# Patient Record
Sex: Female | Born: 1980
Health system: Southern US, Community
[De-identification: ages and names within clinical notes are randomized; demographics above are authoritative.]

## PROBLEM LIST (undated history)

## (undated) DIAGNOSIS — B977 Papillomavirus as the cause of diseases classified elsewhere: Secondary | ICD-10-CM

## (undated) DIAGNOSIS — Z87898 Personal history of other specified conditions: Secondary | ICD-10-CM

## (undated) DIAGNOSIS — K219 Gastro-esophageal reflux disease without esophagitis: Secondary | ICD-10-CM

## (undated) HISTORY — DX: Papillomavirus as the cause of diseases classified elsewhere: B97.7

## (undated) HISTORY — DX: Gastro-esophageal reflux disease without esophagitis: K21.9

## (undated) HISTORY — DX: Personal history of other specified conditions: Z87.898

---
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
## 2002-12-23 HISTORY — PX: BREAST REDUCTION SURGERY: SHX8

## 2009-06-17 ENCOUNTER — Emergency Department (HOSPITAL_COMMUNITY): Admission: EM | Admit: 2009-06-17 | Discharge: 2009-06-17 | Payer: Self-pay | Admitting: Family Medicine

## 2010-02-10 ENCOUNTER — Inpatient Hospital Stay (HOSPITAL_COMMUNITY): Admission: AD | Admit: 2010-02-10 | Discharge: 2010-02-14 | Payer: Self-pay | Admitting: Obstetrics and Gynecology

## 2010-02-11 ENCOUNTER — Encounter (INDEPENDENT_AMBULATORY_CARE_PROVIDER_SITE_OTHER): Payer: Self-pay | Admitting: Obstetrics and Gynecology

## 2010-02-15 ENCOUNTER — Encounter: Admission: RE | Admit: 2010-02-15 | Discharge: 2010-03-17 | Payer: Self-pay | Admitting: Obstetrics and Gynecology

## 2010-04-15 ENCOUNTER — Encounter: Admission: RE | Admit: 2010-04-15 | Discharge: 2010-05-15 | Payer: Self-pay | Admitting: Obstetrics and Gynecology

## 2010-05-16 ENCOUNTER — Encounter: Admission: RE | Admit: 2010-05-16 | Discharge: 2010-06-15 | Payer: Self-pay | Admitting: Obstetrics and Gynecology

## 2010-06-16 ENCOUNTER — Encounter: Admission: RE | Admit: 2010-06-16 | Discharge: 2010-07-16 | Payer: Self-pay | Admitting: Obstetrics and Gynecology

## 2010-07-17 ENCOUNTER — Encounter: Admission: RE | Admit: 2010-07-17 | Discharge: 2010-08-16 | Payer: Self-pay | Admitting: Obstetrics and Gynecology

## 2010-08-17 ENCOUNTER — Encounter: Admission: RE | Admit: 2010-08-17 | Discharge: 2010-09-16 | Payer: Self-pay | Admitting: Obstetrics and Gynecology

## 2010-09-17 ENCOUNTER — Encounter: Admission: RE | Admit: 2010-09-17 | Discharge: 2010-09-17 | Payer: Self-pay | Admitting: Obstetrics and Gynecology

## 2011-03-13 LAB — CBC
HCT: 25.2 % — ABNORMAL LOW (ref 36.0–46.0)
HCT: 25.8 % — ABNORMAL LOW (ref 36.0–46.0)
HCT: 33.7 % — ABNORMAL LOW (ref 36.0–46.0)
Hemoglobin: 8.4 g/dL — ABNORMAL LOW (ref 12.0–15.0)
Hemoglobin: 8.7 g/dL — ABNORMAL LOW (ref 12.0–15.0)
MCHC: 33.3 g/dL (ref 30.0–36.0)
MCHC: 33.4 g/dL (ref 30.0–36.0)
MCV: 89.5 fL (ref 78.0–100.0)
Platelets: 161 10*3/uL (ref 150–400)
RBC: 2.86 MIL/uL — ABNORMAL LOW (ref 3.87–5.11)
RBC: 3.76 MIL/uL — ABNORMAL LOW (ref 3.87–5.11)
RDW: 14.1 % (ref 11.5–15.5)
WBC: 11.7 10*3/uL — ABNORMAL HIGH (ref 4.0–10.5)
WBC: 14.9 10*3/uL — ABNORMAL HIGH (ref 4.0–10.5)

## 2011-03-13 LAB — RH IMMUNE GLOB WKUP(>/=20WKS)(NOT WOMEN'S HOSP)

## 2011-04-01 LAB — CBC
HCT: 36.8 % (ref 36.0–46.0)
MCV: 88.8 fL (ref 78.0–100.0)
Platelets: 278 10*3/uL (ref 150–400)
WBC: 8.8 10*3/uL (ref 4.0–10.5)

## 2011-04-01 LAB — WET PREP, GENITAL: Yeast Wet Prep HPF POC: NONE SEEN

## 2011-04-01 LAB — POCT URINALYSIS DIP (DEVICE)
Bilirubin Urine: NEGATIVE
Glucose, UA: NEGATIVE mg/dL
Nitrite: NEGATIVE
pH: 7.5 (ref 5.0–8.0)

## 2011-04-01 LAB — URINE CULTURE
Colony Count: NO GROWTH
Culture: NO GROWTH

## 2011-04-01 LAB — GC/CHLAMYDIA PROBE AMP, GENITAL
Chlamydia, DNA Probe: NEGATIVE
GC Probe Amp, Genital: NEGATIVE

## 2011-06-17 ENCOUNTER — Other Ambulatory Visit: Payer: Self-pay | Admitting: Obstetrics and Gynecology

## 2011-11-02 ENCOUNTER — Encounter: Payer: Self-pay | Admitting: Emergency Medicine

## 2011-11-02 ENCOUNTER — Emergency Department (HOSPITAL_COMMUNITY)
Admission: EM | Admit: 2011-11-02 | Discharge: 2011-11-02 | Disposition: A | Discharge status: Home / Self Care | Payer: 59 | Attending: Emergency Medicine | Admitting: Emergency Medicine

## 2011-11-02 DIAGNOSIS — S61209A Unspecified open wound of unspecified finger without damage to nail, initial encounter: Secondary | ICD-10-CM | POA: Insufficient documentation

## 2011-11-02 DIAGNOSIS — Y92009 Unspecified place in unspecified non-institutional (private) residence as the place of occurrence of the external cause: Secondary | ICD-10-CM | POA: Insufficient documentation

## 2011-11-02 DIAGNOSIS — Z9889 Other specified postprocedural states: Secondary | ICD-10-CM | POA: Insufficient documentation

## 2011-11-02 DIAGNOSIS — S61219A Laceration without foreign body of unspecified finger without damage to nail, initial encounter: Secondary | ICD-10-CM

## 2011-11-02 DIAGNOSIS — W260XXA Contact with knife, initial encounter: Secondary | ICD-10-CM | POA: Insufficient documentation

## 2011-11-02 MED ORDER — TETANUS-DIPHTH-ACELL PERTUSSIS 5-2.5-18.5 LF-MCG/0.5 IM SUSP
0.5000 mL | Freq: Once | INTRAMUSCULAR | Status: AC
  Administered 2011-11-02: 0.5 mL via INTRAMUSCULAR

## 2011-11-02 MED ORDER — LIDOCAINE HCL (PF) 1 % IJ SOLN
5.0000 mL | Freq: Once | INTRAMUSCULAR | Status: AC
  Administered 2011-11-02: 5 mL
  Filled 2011-11-02: qty 5

## 2011-11-02 NOTE — ED Notes (Signed)
1.5 inch laceration to 4 th finger on l/hand- from butter knife

## 2011-11-02 NOTE — ED Provider Notes (Signed)
Medical screening examination/treatment/procedure(s) were performed by non-physician practitioner and as supervising physician I was immediately available for consultation/collaboration.  [employer]on [employer] R. Margree Gimbel, MD 11/02/11 1647 

## 2011-11-02 NOTE — ED Provider Notes (Signed)
History     CSN: [id number] Arrival date & time: 11/02/2011  9:08 AM   First MD Initiated Contact with Patient 11/02/11 [phone]      Chief Complaint  Patient presents with  . Extremity Laceration    1.5 inch lacertion 4 th finger r/hand    (Consider location/radiation/quality/duration/timing/severity/associated sxs/prior treatment) Patient is a 30 y.o. female presenting with skin laceration.  Laceration    Patient was using a butter knife at home this morning about an hour ago when it slipped and caused a laceration to her left ring finger. She is unsure of her tetanus status. Bleeding was well controlled by the time she arrived in the dept. She has no numbness or weakness to the finger and has no reduction in ROM.  History reviewed. No pertinent past medical history.  Past Surgical History  Procedure Date  . Breast reduction surgery   . Cesarean section     Family History  Problem Relation Age of Onset  . Diabetes Father   . Hypertension Father     History  Substance Use Topics  . Smoking status: Never Smoker   . Smokeless tobacco: Not on file  . Alcohol Use: No    OB History    Grav Para Term Preterm Abortions TAB SAB Ect Mult Living   2 2              Review of Systems ROS negative except as marked in HPI.  Allergies  Review of patient's allergies indicates no known allergies.  Home Medications   Current Outpatient Rx  Name Route Sig Dispense Refill  . LEVONORGESTREL 20 MCG/24HR IU IUD Intrauterine 1 each by Intrauterine route once.        BP 129/86  Pulse 94  Temp 98.2 F (36.8 C)  Resp 20  SpO2 98%  Physical Exam  Nursing note and vitals reviewed. Constitutional: She is oriented to person, place, and time. She appears well-developed and well-nourished. No distress.  HENT:  Head: Normocephalic and atraumatic.  Right Ear: External ear normal.  Left Ear: External ear normal.  Eyes: Conjunctivae are normal. Pupils are equal, round, and reactive  to light.  Musculoskeletal: Normal range of motion. She exhibits no tenderness.  Neurological: She is alert and oriented to person, place, and time.  Skin: Skin is warm and dry. No rash noted.       A small, relatively superficial laceration noted to the medial surface of the left ring finger at the PIP. It is U-shaped. Bleeding is well controlled. Patient has full flexion and extension as well as strength in resisted flexion and extension. She does not have any numbness to the finger. Capillary refill less than 3 seconds.    ED Course  Procedures (including critical care time)  LACERATION REPAIR Performed by: Grant Fontana Authorized by: Grant Fontana Consent: Verbal consent obtained. Risks and benefits: risks, benefits and alternatives were discussed Consent given by: patient Patient identity confirmed: provided demographic data Prepped and Draped in normal sterile fashion Wound explored  Laceration Location: L ring finger  Laceration Length: 1.5 cm  No Foreign Bodies seen or palpated  Anesthesia: digital block  Local anesthetic: lidocaine 1% without epinephrine  Anesthetic total: 2 ml  Irrigation method: syringe Amount of cleaning: standard  Skin closure: Prolene  Number of sutures: 3  Technique: simple interrupted  Patient tolerance: Patient tolerated the procedure well with no immediate complications.   Labs Reviewed - No data to display No results found.  1. Laceration of finger of left hand       MDM  Patient's wound was successfully closed with suture. She is instructed to return for suture removal. She is instructed regarding signs and symptoms of infection that would prompt a reevaluation sooner. She verbalized understanding and agreed to plan.       Grant Fontana, [place] 11/02/11 1625

## 2011-11-02 NOTE — ED Notes (Signed)
sutures and wound care completed by PA and RN

## 2012-04-28 ENCOUNTER — Encounter (HOSPITAL_COMMUNITY): Payer: Self-pay | Admitting: Emergency Medicine

## 2012-04-28 ENCOUNTER — Emergency Department (HOSPITAL_COMMUNITY)
Admission: EM | Admit: 2012-04-28 | Discharge: 2012-04-28 | Disposition: A | Discharge status: Home / Self Care | Payer: 59 | Source: Home / Self Care | Attending: Emergency Medicine | Admitting: Emergency Medicine

## 2012-04-28 DIAGNOSIS — J039 Acute tonsillitis, unspecified: Secondary | ICD-10-CM

## 2012-04-28 LAB — POCT RAPID STREP A: Streptococcus, Group A Screen (Direct): NEGATIVE

## 2012-04-28 MED ORDER — PENICILLIN V POTASSIUM 500 MG PO TABS: 500.0000 mg | ORAL_TABLET | Freq: Three times a day (TID) | ORAL | Status: AC

## 2012-04-28 NOTE — ED Provider Notes (Signed)
Chief Complaint  Patient presents with  . Sore Throat    History of Present Illness:   The patient is a 31 year old female who has had a three-day history of sore throat, pain on swallowing, right ear pain, chills, temperature up to 101, myalgias, headache, a slight cough. She has not been exposed to strep. She denies any nasal congestion, rhinorrhea, abdominal pain, nausea, or vomiting.  Review of Systems:  Other than noted above, the patient denies any of the following symptoms. Systemic:  No fever, chills, sweats, fatigue, myalgias, headache, or anorexia. Eye:  No redness, pain or drainage. ENT:  No earache, ear congestion, nasal congestion, sneezing, rhinorrhea, sinus pressure, sinus pain, post nasal drip, or sore throat. Lungs:  No cough, sputum production, wheezing, shortness of breath, or chest pain. GI:  No abdominal pain, nausea, vomiting, or diarrhea. Skin:  No rash or itching.  PMFSH:  Past medical history, family history, social history, meds, and allergies were reviewed.  Physical Exam:   Vital signs:  BP 120/57  Pulse 73  Temp(Src) 97.4 F (36.3 C) (Oral)  SpO2 98%  LMP 04/28/2012 General:  Alert, in no distress. Eye:  No conjunctival injection or drainage. Lids were normal. ENT:  TMs and canals were normal, without erythema or inflammation.  Nasal mucosa was clear and uncongested, without drainage.  Mucous membranes were moist.  Tonsils were enlarged and red, particularly the right side with some spots of white exudate on the right.  There were no oral ulcerations or lesions. Neck:  Supple, no adenopathy, tenderness or mass. Lungs:  No respiratory distress.  Lungs were clear to auscultation, without wheezes, rales or rhonchi.  Breath sounds were clear and equal bilaterally. Lungs were resonant to percussion.  No egophony. Heart:  Regular rhythm, without gallops, murmers or rubs. Skin:  Clear, warm, and dry, without rash or lesions.  Labs:   Results for orders placed  during the hospital encounter of 04/28/12  POCT RAPID STREP A ([facility] CARE ONLY)      Component Value Range   Streptococcus, Group A Screen (Direct) NEGATIVE  NEGATIVE    Assessment:  The encounter diagnosis was Tonsillitis.  Plan:   1.  The following meds were prescribed:   New Prescriptions   PENICILLIN V POTASSIUM (VEETID) 500 MG TABLET    Take 1 tablet (500 mg total) by mouth 3 (three) times daily.   2.  The patient was instructed in symptomatic care and handouts were given. 3.  The patient was told to return if becoming worse in any way, if no better in 3 or 4 days, and given some red flag symptoms that would indicate earlier return.   Reuben Likes, MD 04/28/12 1750

## 2012-04-28 NOTE — Discharge Instructions (Signed)
Tonsillitis Tonsils are lumps of lymphoid tissues at the back of the throat. Each tonsil has 20 crevices (crypts). Tonsils help fight nose and throat infections and keep infection from spreading to other parts of the body for the first 18 months of life. Tonsillitis is an infection of the throat that causes the tonsils to become red, tender, and swollen. CAUSES Sudden and, if treated, temporary (acute) tonsillitis is usually caused by infection with streptococcal bacteria. Long lasting (chronic) tonsillitis occurs when the crypts of the tonsils become filled with pieces of food and bacteria, which makes it easy for the tonsils to become constantly infected. SYMPTOMS  Symptoms of tonsillitis include:  A sore throat.   White patches on the tonsils.   Fever.   Tiredness.  DIAGNOSIS Tonsillitis can be diagnosed through a physical exam. Diagnosis can be confirmed with the results of lab tests, including a throat culture. TREATMENT  The goals of tonsillitis treatment include the reduction of the severity and duration of symptoms, prevention of associated conditions, and prevention of disease transmission. Tonsillitis caused by bacteria can be treated with antibiotics. Usually, treatment with antibiotics is started before the cause of the tonsillitis is known. However, if it is determined that the cause is not bacterial, antibiotics will not treat the tonsillitis. If attacks of tonsillitis are severe and frequent, your caregiver may recommend surgery to remove the tonsils (tonsillectomy). HOME CARE INSTRUCTIONS   Rest as much as possible and get plenty of sleep.   Drink plenty of fluids. While the throat is very sore, eat soft foods or liquids, such as sherbet, soups, or instant breakfast drinks.   Eat frozen ice pops.   Older children and adults may gargle with a warm or cold liquid to help soothe the throat. Mix 1 teaspoon of salt in 1 cup of water.   Other family members who also develop a  sore throat or fever should have a medical exam or throat culture.   Only take over-the-counter or prescription medicines for pain, discomfort, or fever as directed by your caregiver.   If you are given antibiotics, take them as directed. Finish them even if you start to feel better.  SEEK MEDICAL CARE IF:   Your baby is older than 3 months with a rectal temperature of 100.5 F (38.1 C) or higher for more than 1 day.   Large, tender lumps develop in your neck.   A rash develops.   Green, yellow-brown, or bloody substance is coughed up.   You are unable to swallow liquids or food for 24 hours.   Your child is unable to swallow food or liquids for 12 hours.  SEEK IMMEDIATE MEDICAL CARE IF:   You develop any new symptoms such as vomiting, severe headache, stiff neck, chest pain, or trouble breathing or swallowing.   You have severe throat pain along with drooling or voice changes.   You have severe pain, unrelieved with recommended medications.   You are unable to fully open the mouth.   You develop redness, swelling, or severe pain anywhere in the neck.   You have a fever.   Your baby is older than 3 months with a rectal temperature of 102 F (38.9 C) or higher.   Your baby is 12 months old or younger with a rectal temperature of 100.4 F (38 C) or higher.  MAKE SURE YOU:   Understand these instructions.   Will watch your condition.   Will get help right away if you are not  watch your condition.   Will get help right away if you are not doing well or get worse.  Document Released: 09/18/2005 Document Revised: 11/28/2011 Document Reviewed: 02/14/2011  ExitCare Patient Information 2012 [employer].

## 2012-04-28 NOTE — ED Notes (Addendum)
Sore throat , aches, right ear and right side of throat is painful. Onset Sunday evening.  Patient reports fever 101 this morning

## 2013-03-15 ENCOUNTER — Ambulatory Visit (INDEPENDENT_AMBULATORY_CARE_PROVIDER_SITE_OTHER): Payer: 59 | Admitting: Internal Medicine

## 2013-03-15 ENCOUNTER — Encounter: Payer: Self-pay | Admitting: Internal Medicine

## 2013-03-15 VITALS — BP 120/84 | HR 78 | Temp 98.2°F | Ht 68.0 in | Wt 245.0 lb

## 2013-03-15 DIAGNOSIS — Z6837 Body mass index (BMI) 37.0-37.9, adult: Secondary | ICD-10-CM | POA: Insufficient documentation

## 2013-03-15 NOTE — Assessment & Plan Note (Addendum)
She has been overweight all her life, in the last few years the lowest weight was 185, gained wt while  pregnant 3 years ago and even more weight  after the delivery. Went to a bariatric clinic 12-2012, started phentermine and hCG shots, has dropped a total of 12 pounds, 4 pounds even before she started the shots. She states that the shots and medication are temporary only, I warned her about the rebound weight gain. I encouraged her to develop healthy eating habits and stay active.

## 2013-03-15 NOTE — Progress Notes (Signed)
  Subjective:    Patient ID: Anna Cabrera, female    DOB: [birth year], 32 y.o.   MRN: [id number]  HPI Here to get stablished --BMI 37-- had issues with her weight all her life see assessment and plan. --History of GERD, improved since she started to lose weight. --Complains of fatigue, for more than a year, thinks related to a combination of factors including stress in general, also has a 38-year-old baby at home, some nights is difficult to sleep well. --Note that a ecchymoses at the left leg 2 weeks ago, area does not look completely normal yet. --Had labs 12-2012: We went over there results, all very good, LDL 94, TSH 3.2 which is normal. T4 normal as well.  Past Medical History  Diagnosis Date  . GERD (gastroesophageal reflux disease)   . H/O headache   . HPV in female   . BMI 37.0-37.9,adult    Past Surgical History  Procedure Laterality Date  . Breast reduction surgery  2004  . Cesarean section  2011  . Cyst removal hand  1996   History   Social History  . Marital Status: Married    Spouse Name: N/A    Number of Children: 1  . Years of Education: N/A   Occupational History  . manager  [employer]   Social History Main Topics  . Smoking status: Former Smoker    Quit date: 03/16/2003  . Smokeless tobacco: Never Used  . Alcohol Use: Yes     Comment: rarely   . Drug Use: Not on file  . Sexually Active: Not on file   Other Topics Concern  . Not on file   Social History Narrative   Associated degree    Family History  Problem Relation Age of Onset  . Diabetes Father   . Hypertension Father   . Stroke Other     F (age 82), GM  . CAD Father     CABG 60  . Cancer - Colon Neg Hx   . Breast cancer Neg Hx       Review of Systems Admits to snoring, denies inappropriate falling asleep. Used to have headaches, they are improved. Denies any nose or gum bleeding, no blood in the urine ;occ red blood per rectum, thinks related to constipation, symptoms are not  frequent. Diet has improved, still lacking exercise..     Objective:   Physical Exam  BP 120/84  Pulse 78  Temp(Src) 98.2 F (36.8 C) (Oral)  Ht 5\' 8"  (1.727 m)  Wt 245 lb (111.131 kg)  BMI 37.26 kg/m2  SpO2 98% General -- alert, well-developed  Neck --no thyromegaly   Lungs -- normal respiratory effort, no intercostal retractions, no accessory muscle use, and normal breath sounds.   Heart-- normal rate, regular rhythm, no murmur, and no gallop.    Extremities-- no pretibial edema bilaterally, left leg has an area of ecchymoses, probably 34 or 29 weeks old.  Neurologic-- alert & oriented X3 and strength normal in all extremities. Psych-- Cognition and judgment appear intact. Alert and cooperative with normal attention span and concentration.  not anxious appearing and not depressed appearing.      Assessment & Plan:  32 year old lady here to get established, doing well. Recent labs normal, including her TSH; recommend to repeat labs every 2 years.

## 2014-05-10 ENCOUNTER — Ambulatory Visit (HOSPITAL_BASED_OUTPATIENT_CLINIC_OR_DEPARTMENT_OTHER)
Admission: RE | Admit: 2014-05-10 | Discharge: 2014-05-10 | Disposition: A | Discharge status: Home / Self Care | Payer: 59 | Source: Ambulatory Visit | Attending: Internal Medicine | Admitting: Internal Medicine

## 2014-05-10 ENCOUNTER — Encounter: Payer: Self-pay | Admitting: Internal Medicine

## 2014-05-10 ENCOUNTER — Ambulatory Visit (INDEPENDENT_AMBULATORY_CARE_PROVIDER_SITE_OTHER): Payer: 59 | Admitting: Internal Medicine

## 2014-05-10 VITALS — BP 113/79 | HR 94 | Temp 99.5°F | Wt 237.0 lb

## 2014-05-10 DIAGNOSIS — J111 Influenza due to unidentified influenza virus with other respiratory manifestations: Secondary | ICD-10-CM

## 2014-05-10 DIAGNOSIS — R079 Chest pain, unspecified: Secondary | ICD-10-CM | POA: Insufficient documentation

## 2014-05-10 DIAGNOSIS — R0602 Shortness of breath: Secondary | ICD-10-CM | POA: Insufficient documentation

## 2014-05-10 LAB — POCT INFLUENZA A/B
INFLUENZA B, POC: POSITIVE
Influenza A, POC: POSITIVE

## 2014-05-10 MED ORDER — HYDROCODONE-HOMATROPINE 5-1.5 MG/5ML PO SYRP: 5.0000 mL | ORAL_SOLUTION | Freq: Every evening | ORAL | Status: DC | PRN

## 2014-05-10 NOTE — Progress Notes (Signed)
Pre-visit discussion using our clinic review tool. No additional management support is needed unless otherwise documented below in the visit note.  

## 2014-05-10 NOTE — Patient Instructions (Signed)
Get the XR at [facility], corner [address] (10 minutes form here); they are open 24/7 [address]  [place], [place] [id number] [phone]   Rest, fluids , tylenol For cough, take Mucinex DM twice a day as needed  If the cough is severe at night, take hydrocodone  Call if no better in few days Call anytime if the symptoms are severe, you have high fever, short of breath, chest pain     Influenza, Adult Influenza ("the flu") is a viral infection of the respiratory tract. It occurs more often in winter months because people spend more time in close contact with one another. Influenza can make you feel very sick. Influenza easily spreads from person to person (contagious). CAUSES  Influenza is caused by a virus that infects the respiratory tract. You can catch the virus by breathing in droplets from an infected person's cough or sneeze. You can also catch the virus by touching something that was recently contaminated with the virus and then touching your mouth, nose, or eyes. SYMPTOMS  Symptoms typically last 4 to 10 days and may include:  Fever.  Chills.  Headache, body aches, and muscle aches.  Sore throat.  Chest discomfort and cough.  Poor appetite.  Weakness or feeling tired.  Dizziness.  Nausea or vomiting. DIAGNOSIS  Diagnosis of influenza is often made based on your history and a physical exam. A nose or throat swab test can be done to confirm the diagnosis. RISKS AND COMPLICATIONS You may be at risk for a more severe case of influenza if you smoke cigarettes, have diabetes, have chronic heart disease (such as heart failure) or lung disease (such as asthma), or if you have a weakened immune system. Elderly people and pregnant women are also at risk for more serious infections. The most common complication of influenza is a lung infection (pneumonia). Sometimes, this complication can require emergency medical care and may be  life-threatening. PREVENTION  An annual influenza vaccination (flu shot) is the best way to avoid getting influenza. An annual flu shot is now routinely recommended for all adults in the [place] TREATMENT  In mild cases, influenza goes away on its own. Treatment is directed at relieving symptoms. For more severe cases, your caregiver may prescribe antiviral medicines to shorten the sickness. Antibiotic medicines are not effective, because the infection is caused by a virus, not by bacteria. HOME CARE INSTRUCTIONS  Only take over-the-counter or prescription medicines for pain, discomfort, or fever as directed by your caregiver.  Use a cool mist humidifier to make breathing easier.  Get plenty of rest until your temperature returns to normal. This usually takes 3 to 4 days.  Drink enough fluids to keep your urine clear or pale yellow.  Cover your mouth and nose when coughing or sneezing, and wash your hands well to avoid spreading the virus.  Stay home from work or school until your fever has been gone for at least 1 full day. SEEK MEDICAL CARE IF:   You have chest pain or a deep cough that worsens or produces more mucus.  You have nausea, vomiting, or diarrhea. SEEK IMMEDIATE MEDICAL CARE IF:   You have difficulty breathing, shortness of breath, or your skin or nails turn bluish.  You have severe neck pain or stiffness.  You have a severe headache, facial pain, or earache.  You have a worsening or recurring fever.  You have nausea or vomiting that cannot  be controlled. MAKE SURE YOU:  Understand these instructions.  Will watch your condition.  Will get help right away if you are not doing well or get worse. Document Released: 12/06/2000 Document Revised: 06/09/2012 Document Reviewed: 03/09/2012 [facility] Patient Information 2014 [employer], [place].

## 2014-05-10 NOTE — Progress Notes (Signed)
Subjective:    Patient ID: Anna Cabrera, female    DOB: 17-Jan-1981, 33 y.o.   MRN: [id number]  DOS:  05/10/2014 Type of  visit: Acute visit Symptoms started 4 days ago with fever up to 101.0, chills, generalized aches, cough. Some sputum production. She has frontal headaches mostly when she coughs. Also has noted right-sided thoracic pain   without cough only for the last few days. Her daughter is also sick with a febrile illness.  ROS [place] outside [place] did visit a farm 4 days ago. No sore throat, sinus pain or congestion, + mild nasal discharge. No SOB No difficulty breathing or actual chest pain. No nausea, vomiting, diarrhea.   Past Medical History  Diagnosis Date  . GERD (gastroesophageal reflux disease)   . H/O headache   . HPV in female   . BMI 37.0-37.9,adult     Past Surgical History  Procedure Laterality Date  . Breast reduction surgery  2004  . Cesarean section  2011  . Cyst removal hand  1996    History   Social History  . Marital Status: Married    Spouse Name: N/A    Number of Children: 1  . Years of Education: N/A   Occupational History  . manager  [employer]   Social History Main Topics  . Smoking status: Former Smoker    Quit date: 03/16/2003  . Smokeless tobacco: Never Used  . Alcohol Use: Yes     Comment: rarely   . Drug Use: Not on file  . Sexual Activity: Not on file   Other Topics Concern  . Not on file   Social History Narrative   Associated degree         Medication List       This list is accurate as of: 05/10/14  3:25 PM.  Always use your most recent med list.               ibuprofen 200 MG tablet  Commonly known as:  ADVIL,MOTRIN  Take 400 mg by mouth every 6 (six) hours as needed. For pain     levonorgestrel 20 MCG/24HR IUD  Commonly known as:  MIRENA  1 each by Intrauterine route once.           Objective:   Physical Exam BP 113/79  Pulse 94  Temp(Src) 99.5 F (37.5 C)  (Oral)  Wt 237 lb (107.502 kg)  SpO2 97% General -- alert, well-developed, NAD.  Nontoxic appearing. HEENT-- Not pale. TMs normal, throat symmetric, no redness or discharge. Face symmetric, sinuses not tender to palpation. Nose slt congested.  Lungs -- normal respiratory effort, no intercostal retractions, no accessory muscle use, and normal breath sounds.  Heart-- normal rate, regular rhythm, no murmur.  Extremities-- no pretibial edema bilaterally  Neurologic--  alert & oriented X3. Speech normal, gait normal, strength normal in all extremities.  Psych-- Cognition and judgment appear intact. Cooperative with normal attention span and concentration. No anxious or depressed appearing.        Assessment & Plan:   Influenza Fever and respiratory symptoms for the last 3 days, flu  test + positive. She does not look toxic, has been sick for more than 48 hours, I don't think Tamiflu is indicated. Conservative treatment with rest, fluids, Tylenol and cough suppression. A chest x-ray because the patient has a right-sided posterior chest pain, rule out pneumonia. Patient is aware she has influenza not a regular cold, if  she gets worse she needs to let me know.

## 2014-05-11 ENCOUNTER — Other Ambulatory Visit: Payer: Self-pay | Admitting: Internal Medicine

## 2014-05-11 DIAGNOSIS — R9389 Abnormal findings on diagnostic imaging of other specified body structures: Secondary | ICD-10-CM

## 2014-05-12 ENCOUNTER — Encounter: Payer: Self-pay | Admitting: *Deleted

## 2014-05-12 ENCOUNTER — Telehealth: Payer: Self-pay | Admitting: *Deleted

## 2014-05-12 NOTE — Telephone Encounter (Signed)
Pt returned call. Please call back.

## 2014-05-12 NOTE — Telephone Encounter (Signed)
Left a detailed message on pts voice mail making her aware that the Xray showed a shadow that was probably the nipple and that this needed to be repeated with a nipple marker.

## 2014-05-12 NOTE — Telephone Encounter (Signed)
Left message on voice mail for the patient to return my call regarding Xray results

## 2014-05-12 NOTE — Telephone Encounter (Signed)
Message copied by Baldwin JamaicaJOHNSON, Yolette Hastings G on Thu May 12, 2014  9:50 AM ------      Message from: Willow OraPAZ, JOSE E      Created: Wed May 11, 2014  4:16 PM       Advise patient, chest x-ray showed no pneumonia. She does has a small shadow, probably the nipple.       Advise she needs a repeated XR i already entered the order for a  chest x-ray with nipple markers. ------

## 2014-05-12 NOTE — Progress Notes (Signed)
Letter sent to patient with Xray results and note from Dr. Drue NovelPaz to repeat this.

## 2014-10-24 ENCOUNTER — Encounter: Payer: Self-pay | Admitting: Internal Medicine

## 2014-12-30 ENCOUNTER — Encounter: Payer: Self-pay | Admitting: Internal Medicine

## 2014-12-30 ENCOUNTER — Ambulatory Visit (INDEPENDENT_AMBULATORY_CARE_PROVIDER_SITE_OTHER): Payer: 59 | Admitting: Internal Medicine

## 2014-12-30 VITALS — BP 127/84 | HR 85 | Temp 98.1°F | Wt 254.5 lb

## 2014-12-30 DIAGNOSIS — M5441 Lumbago with sciatica, right side: Secondary | ICD-10-CM

## 2014-12-30 MED ORDER — PREDNISONE 10 MG PO TABS: ORAL_TABLET | ORAL | Status: DC

## 2014-12-30 NOTE — Progress Notes (Signed)
   Subjective:    Patient ID: Anna Cabrera, female    DOB: 18-Jul-1981, 34 y.o.   MRN: [id number]  DOS:  12/30/2014 Type of visit - description : acute Interval history: Symptoms started a few days ago with a stabbing, on and off pain at the right lower back, it was associated with a steady ache at the posterior aspect of the right leg up to the calf. Overall symptoms are somehow better. Had similar symptoms several months ago that self resolve.   ROS  Denies fever chills.No injury. No rash No bladder or bowel incontinence, no motor deficits      Past Medical History  Diagnosis Date  . GERD (gastroesophageal reflux disease)   . H/O headache   . HPV in female   . BMI 37.0-37.9,adult     Past Surgical History  Procedure Laterality Date  . Breast reduction surgery  2004  . Cesarean section  2011  . Cyst removal hand  1996    History   Social History  . Marital Status: Married    Spouse Name: N/A    Number of Children: 1  . Years of Education: N/A   Occupational History  . manager  [employer]   Social History Main Topics  . Smoking status: Former Smoker    Quit date: 03/16/2003  . Smokeless tobacco: Never Used  . Alcohol Use: Yes     Comment: rarely   . Drug Use: Not on file  . Sexual Activity: Not on file   Other Topics Concern  . Not on file   Social History Narrative   Associated degree         Medication List       This list is accurate as of: 12/30/14 11:59 PM.  Always use your most recent med list.               ibuprofen 200 MG tablet  Commonly known as:  ADVIL,MOTRIN  Take 400 mg by mouth every 6 (six) hours as needed. For pain     levonorgestrel 20 MCG/24HR IUD  Commonly known as:  MIRENA  1 each by Intrauterine route once.     predniSONE 10 MG tablet  Commonly known as:  DELTASONE  4 tablets x 2 days, 3 tabs x 2 days, 2 tabs x 2 days, 1 tab x 2 days           Objective:   Physical Exam BP 127/84 mmHg  Pulse 85  Temp(Src)  98.1 F (36.7 C) (Oral)  Wt 254 lb 8 oz (115.44 kg)  SpO2 96% General -- alert, well-developed, NAD.  Back--  Thoracic and lumbar spine no TTP, slightly tender at the right lower back. Extremities-- no pretibial edema bilaterally  Neurologic--  alert & oriented X3. Speech normal, gait appropriate for age, strength symmetric and appropriate for age.  DTRs symmetric. Straight leg test, ?  + on the right side.  Psych-- Cognition and judgment appear intact. Cooperative with normal attention span and concentration. No anxious or depressed appearing.     Assessment & Plan:

## 2014-12-30 NOTE — Progress Notes (Signed)
Pre visit review using our clinic review tool, if applicable. No additional management support is needed unless otherwise documented below in the visit note. 

## 2014-12-30 NOTE — Patient Instructions (Signed)
Take prednisone as prescribed for few days  For pain okay to take Tylenol or Motrin as needed  Warm compress  Once  better start physical therapy at home.  If the pain is intense,  not getting better or it keeps coming back ---- please call the office  [facility] Website with information about home physical therapy for back pain: [email or web address]/healthy-living/adult-health/multimedia/back-pain/[email or web address]

## 2014-12-31 DIAGNOSIS — M549 Dorsalgia, unspecified: Secondary | ICD-10-CM | POA: Insufficient documentation

## 2014-12-31 NOTE — Assessment & Plan Note (Signed)
Back pain with associated symptoms consistent with radiculopathy. Conservative treatment with prednisone, Tylenol or Motrin. Once better recommend physical therapy, we discussed referral versus self physical therapy and elected self physical therapy. See instructions.

## 2015-04-04 ENCOUNTER — Other Ambulatory Visit: Payer: Self-pay | Admitting: Obstetrics and Gynecology

## 2015-04-05 LAB — CYTOLOGY - PAP

## 2015-04-18 ENCOUNTER — Ambulatory Visit (INDEPENDENT_AMBULATORY_CARE_PROVIDER_SITE_OTHER): Payer: 59 | Admitting: Neurology

## 2015-04-18 ENCOUNTER — Encounter: Payer: Self-pay | Admitting: Neurology

## 2015-04-18 ENCOUNTER — Telehealth: Payer: Self-pay | Admitting: *Deleted

## 2015-04-18 VITALS — BP 115/81 | HR 75 | Temp 98.2°F | Ht 68.0 in | Wt 256.2 lb

## 2015-04-18 DIAGNOSIS — G43001 Migraine without aura, not intractable, with status migrainosus: Secondary | ICD-10-CM | POA: Diagnosis not present

## 2015-04-18 DIAGNOSIS — G441 Vascular headache, not elsewhere classified: Secondary | ICD-10-CM | POA: Diagnosis not present

## 2015-04-18 MED ORDER — DICLOFENAC POTASSIUM(MIGRAINE) 50 MG PO PACK: 50.0000 mg | PACK | Freq: Once | ORAL | Status: DC | PRN

## 2015-04-18 MED ORDER — ELETRIPTAN HYDROBROMIDE 40 MG PO TABS: 40.0000 mg | ORAL_TABLET | Freq: Once | ORAL | Status: DC

## 2015-04-18 NOTE — Progress Notes (Signed)
[facility]    Provider:  Dr Lucia Gaskins Referring Provider: Zelphia Cairo, MD Primary Care Physician:  Willow Ora, MD  CC:  Migraines  HPI:  Anna Cabrera is a 34 y.o. female here as a referral from Dr. Renaldo Fiddler for migraines  Migraines for years. Unilateral, right side, right ptosis, lacrimation, phonophobia, no photophobia. No nausea, no vomiting. Happens around menses. She is using an  IUD now. When she does get then, they are bad. 10/10. Pulsating around the eye. She was taking ibuprofen. Will ease it but not help. Imitrex made her feel bad. Fioricet helps but doesn't stop them. Has failed relpax in the past, sample given by her office. She felt light headed, flushed. Felt like she was going to pass out. No CP, no SOB. Severity can be worse than in previous years, not worse in frequency, but symptoms are similar to migraines she has always had, no new symptoms.    Review of Systems: Patient complains of symptoms per HPI as well as the following symptoms: headaches. No CP, No SOB. Pertinent negatives per HPI. All others negative.   History   Social History  . Marital Status: Married    Spouse Name: Anna Cabrera  . Number of Children: 1  . Years of Education: Associates   Occupational History  . Manager  [employer]   Social History Main Topics  . Smoking status: Former Smoker    Quit date: 03/16/2003  . Smokeless tobacco: Never Used  . Alcohol Use: 0.0 oz/week    0 Standard drinks or equivalent per week     Comment: rarely - wine  . Drug Use: No  . Sexual Activity: Not on file   Other Topics Concern  . Not on file   Social History Narrative   Lives at home with spouse and daughter   Right handed.   Caffeine use: Drinks 4-6 drinks per day (soda and tea)      Associated degree     Family History  Problem Relation Age of Onset  . Diabetes Father   . Hypertension Father   . Stroke Other     F (age 46), GM  . CAD Father     CABG 53  . Cancer -  Colon Neg Hx   . Breast cancer Neg Hx   . High Cholesterol Father   . Migraines Father   . Migraines Mother     Past Medical History  Diagnosis Date  . GERD (gastroesophageal reflux disease)   . H/O headache   . HPV in female   . BMI 37.0-37.9,adult     Past Surgical History  Procedure Laterality Date  . Breast reduction surgery  2004  . Cesarean section  2011  . Cyst removal hand Right 1996    Current Outpatient Prescriptions  Medication Sig Dispense Refill  . ACETAMINOPHEN-BUTALBITAL 50-325 MG TABS Take 1 tablet by mouth every 6 (six) hours as needed.    Marland Kitchen ibuprofen (ADVIL,MOTRIN) 200 MG tablet Take 800 mg by mouth every 6 (six) hours as needed. For pain    . levonorgestrel (MIRENA) 20 MCG/24HR IUD 1 each by Intrauterine route once.      . Diclofenac Potassium 50 MG PACK Take 50 mg by mouth once as needed. Take once daily as needed with headache onset. Please take with food 9 each 6  . eletriptan (RELPAX) 40 MG tablet Take 1 tablet (40 mg total) by mouth once. May repeat in 2 hours if headache persists or recurs.  10 tablet 6  . predniSONE (DELTASONE) 10 MG tablet 4 tablets x 2 days, 3 tabs x 2 days, 2 tabs x 2 days, 1 tab x 2 days (Patient not taking: Reported on 04/18/2015) 20 tablet 0   No current facility-administered medications for this visit.    Allergies as of 04/18/2015 - Review Complete 04/18/2015  Allergen Reaction Noted  . Maxidone [hydrocodone-acetaminophen] Nausea And Vomiting 04/18/2015  . Ortho evra [norelgestromin-eth estradiol]  04/18/2015    Vitals: BP 115/81 mmHg  Pulse 75  Temp(Src) 98.2 F (36.8 C)  Ht  (1.727 m)  Wt 256 lb 3.2 oz (116.212 kg)  BMI 38.96 kg/m2 Last Weight:  Wt Readings from Last 1 Encounters:  04/18/15 256 lb 3.2 oz (116.212 kg)   Last Height:   Ht Readings from Last 1 Encounters:  04/18/15  (1.727 m)   Physical exam: Exam: Gen: NAD, conversant, well nourised, well groomed                     CV: RRR, no MRG.  No Carotid Bruits. No peripheral edema, warm, nontender Eyes: Conjunctivae clear without exudates or hemorrhage  Neuro: Detailed Neurologic Exam  Speech:    Speech is normal; fluent and spontaneous with normal comprehension.  Cognition:    The patient is oriented to person, place, and time;     recent and remote memory intact;     language fluent;     normal attention, concentration,     fund of knowledge Cranial Nerves:    The pupils are equal, round, and reactive to light. The fundi are normal and spontaneous venous pulsations are present. Visual fields are full to finger confrontation. Extraocular movements are intact. Trigeminal sensation is intact and the muscles of mastication are normal. The face is symmetric. The palate elevates in the midline. Hearing intact. Voice is normal. Shoulder shrug is normal. The tongue has normal motion without fasciculations.   Coordination:    Normal finger to nose and heel to shin. Normal rapid alternating movements.   Gait:    Heel-toe and tandem gait are normal.   Motor Observation:    No asymmetry, no atrophy, and no involuntary movements noted. Tone:    Normal muscle tone.    Posture:    Posture is normal. normal erect    Strength:    Strength is V/V in the upper and lower limbs.      Sensation: intact to LT     Reflex Exam:  DTR's:    Deep tendon reflexes in the upper and lower extremities are normal bilaterally.   Toes:    The toes are downgoing bilaterally.   Clonus:    Clonus is absent.       Assessment/Plan:  34 year old female with migraines.  Relpax. Please take one tablet at the onset of your headache. If it does not improve the symptoms please take one additional tablet. Do not take more then 2 tablets in 24hrs. Do not take use more then 2 to 3 days in a week. Cambia at onset of headache.   Discussed the following: To prevent or relieve headaches, try the following: Cool Compress. Lie down and place a cool  compress on your head.  Avoid headache triggers. If certain foods or odors seem to have triggered your migraines in the past, avoid them. A headache diary might help you identify triggers.  Include physical activity in your daily routine. Try a daily walk or other moderate aerobic  exercise.  Manage stress. Find healthy ways to cope with the stressors, such as delegating tasks on your to-do list.  Practice relaxation techniques. Try deep breathing, yoga, massage and visualization.  Eat regularly. Eating regularly scheduled meals and maintaining a healthy diet might help prevent headaches. Also, drink plenty of fluids.  Follow a regular sleep schedule. Sleep deprivation might contribute to headaches Consider biofeedback. With this mind-body technique, you learn to control certain bodily functions - such as muscle tension, heart rate and blood pressure - to prevent headaches or reduce headache pain.    Proceed to emergency room if you experience new or worsening symptoms or symptoms do not resolve, if you have new neurologic symptoms or if headache is severe, or for any concerning symptom.    Naomie DeanAntonia Ahern, MD  [facility] [address] [place], [place] [id number]  Phone [phone] Fax [phone]

## 2015-04-18 NOTE — Telephone Encounter (Signed)
Faxed [employer] prescription and service form at 4:09pm and received fax confirmation.

## 2015-04-18 NOTE — Patient Instructions (Signed)
Overall you are doing fairly well but I do want to suggest a few things today:   Remember to drink plenty of fluid, eat healthy meals and do not skip any meals. Try to eat protein with a every meal and eat a healthy snack such as fruit or nuts in between meals. Try to keep a regular sleep-wake schedule and try to exercise daily, particularly in the form of walking, 20-30 minutes a day, if you can.   As far as your medications are concerned, I would like to suggest: Relpax. Please take one tablet at the onset of your headache. If it does not improve the symptoms please take one additional tablet. Do not take more then 2 tablets in 24hrs. Do not take use more then 2 to 3 days in a week. Cambia at onset of headache.   I would like to see you back in 3 months, sooner if we need to. Please call us with any interim questions, concerns, problems, updates or refill requests.   Please also call us for any test results so we can go over those with you on the phone.  My clinical assistant and will answer any of your questions and relay your messages to me and also relay most of my messages to you.   Our phone number is [phone]. We also have an after hours call service for urgent matters and there is a physician on-call for urgent questions. For any emergencies you know to call 911 or go to the nearest emergency room  To prevent or relieve headaches, try the following: Cool Compress. Lie down and place a cool compress on your head.  Avoid headache triggers. If certain foods or odors seem to have triggered your migraines in the past, avoid them. A headache diary might help you identify triggers.  Include physical activity in your daily routine. Try a daily walk or other moderate aerobic exercise.  Manage stress. Find healthy ways to cope with the stressors, such as delegating tasks on your to-do list.  Practice relaxation techniques. Try deep breathing, yoga, massage and visualization.  Eat regularly.  Eating regularly scheduled meals and maintaining a healthy diet might help prevent headaches. Also, drink plenty of fluids.  Follow a regular sleep schedule. Sleep deprivation might contribute to headaches Consider biofeedback. With this mind-body technique, you learn to control certain bodily functions - such as muscle tension, heart rate and blood pressure - to prevent headaches or reduce headache pain.    Proceed to emergency room if you experience new or worsening symptoms or symptoms do not resolve, if you have new neurologic symptoms or if headache is severe, or for any concerning symptom.

## 2015-07-18 ENCOUNTER — Ambulatory Visit: Payer: 59 | Admitting: Neurology

## 2015-07-19 ENCOUNTER — Encounter: Payer: Self-pay | Admitting: Neurology

## 2015-07-30 ENCOUNTER — Telehealth: Payer: Self-pay | Admitting: Internal Medicine

## 2015-07-30 DIAGNOSIS — R911 Solitary pulmonary nodule: Secondary | ICD-10-CM

## 2015-07-30 NOTE — Telephone Encounter (Signed)
Advise pt: a year ago her CXR showed a nodule, needs to be repeated: Enter orders for:  CXR  CXR w/ a niple marker  Dx pulm nodule

## 2015-07-31 NOTE — Telephone Encounter (Signed)
Chest x-rays ordered, one normal chest x-ray and chest x-ray with left and right sided nipple markers.

## 2016-04-04 DIAGNOSIS — Z6835 Body mass index (BMI) 35.0-35.9, adult: Secondary | ICD-10-CM | POA: Diagnosis not present

## 2016-04-04 DIAGNOSIS — Z01419 Encounter for gynecological examination (general) (routine) without abnormal findings: Secondary | ICD-10-CM | POA: Diagnosis not present

## 2016-04-15 DIAGNOSIS — R102 Pelvic and perineal pain: Secondary | ICD-10-CM | POA: Diagnosis not present

## 2016-11-18 ENCOUNTER — Telehealth: Payer: Self-pay

## 2016-11-18 DIAGNOSIS — R9389 Abnormal findings on diagnostic imaging of other specified body structures: Secondary | ICD-10-CM

## 2016-11-18 NOTE — Telephone Encounter (Signed)
-----   Message from Wanda PlumpJose E Paz, MD sent at 11/16/2016  9:31 PM EST ----- Regarding: call pt  Call pt: due to repeat a CXR, enter order dx  Lung nodule. if unable to reach pt, send a letter AND a mychart message: Anna Cabrera, we tried to contact you regards the need to redo a chest XR because in the past we saw a small spot and we need to know is not growing, please contact our office

## 2016-11-18 NOTE — Telephone Encounter (Signed)
LMOM informing Pt to return call. MyChart message sent and letter printed and mailed to Pt requesting call back. Chest x-ray ordered.

## 2016-11-25 ENCOUNTER — Ambulatory Visit (INDEPENDENT_AMBULATORY_CARE_PROVIDER_SITE_OTHER): Payer: 59 | Admitting: Internal Medicine

## 2016-11-25 ENCOUNTER — Encounter: Payer: Self-pay | Admitting: Internal Medicine

## 2016-11-25 ENCOUNTER — Ambulatory Visit (HOSPITAL_BASED_OUTPATIENT_CLINIC_OR_DEPARTMENT_OTHER)
Admission: RE | Admit: 2016-11-25 | Discharge: 2016-11-25 | Disposition: A | Discharge status: Home / Self Care | Payer: 59 | Source: Ambulatory Visit | Attending: Internal Medicine | Admitting: Internal Medicine

## 2016-11-25 VITALS — BP 118/74 | HR 83 | Temp 98.2°F | Resp 14 | Ht 68.0 in | Wt 254.4 lb

## 2016-11-25 DIAGNOSIS — R938 Abnormal findings on diagnostic imaging of other specified body structures: Secondary | ICD-10-CM | POA: Insufficient documentation

## 2016-11-25 DIAGNOSIS — Z0000 Encounter for general adult medical examination without abnormal findings: Secondary | ICD-10-CM | POA: Diagnosis not present

## 2016-11-25 DIAGNOSIS — R911 Solitary pulmonary nodule: Secondary | ICD-10-CM | POA: Insufficient documentation

## 2016-11-25 DIAGNOSIS — R9389 Abnormal findings on diagnostic imaging of other specified body structures: Secondary | ICD-10-CM

## 2016-11-25 DIAGNOSIS — R918 Other nonspecific abnormal finding of lung field: Secondary | ICD-10-CM | POA: Diagnosis not present

## 2016-11-25 NOTE — Assessment & Plan Note (Addendum)
Td 2012, had a flu shot [facility]- never had a cscope  Female care-- Dr Vickey Sagesatkins  Labs : Will come back fasting for CMP, CBC, FLP, TSH BMI is 38, diet and exercise discussed.

## 2016-11-25 NOTE — Patient Instructions (Addendum)
  GO TO THE FRONT DESK Schedule your next appointment for a physical exam in 1 year    Schedule labs to be done this week   STOP BY THE FIRST FLOOR:  get the XR

## 2016-11-25 NOTE — Progress Notes (Signed)
Subjective:    Patient ID: Anna Cabrera, female    DOB: [birth year], 35 y.o.   MRN: [id number]  DOS:  11/25/2016 Type of visit - description : CPX Interval history: No major concerns    Review of Systems   Constitutional: No fever. No chills. No unexplained wt changes. No unusual sweats  HEENT: No dental problems, no ear discharge, no facial swelling, no voice changes. No eye discharge, no eye  redness , no  intolerance to light   Respiratory: No wheezing , no  difficulty breathing. Had a URI and some cough last week, getting better.  Cardiovascular: No CP, no leg swelling , no  Palpitations  GI: no nausea, no vomiting, no diarrhea , no  abdominal pain.  No blood in the stools. No dysphagia, no odynophagia    Endocrine: No polyphagia, no polyuria , no polydipsia  GU: No dysuria, gross hematuria, difficulty urinating. No urinary urgency, no frequency.  Musculoskeletal: Occasionally has a sharp neck pain with some radiation to the right shoulder and arm, only with certain positions, no persistent pain. No numbness  Skin: No change in the color of the skin, palor , no  Rash  Allergic, immunologic: No environmental allergies , no  food allergies  Neurological: No dizziness no  syncope. No headaches. No diplopia, no slurred, no slurred speech, no motor deficits, no facial  Numbness  Hematological: No enlarged lymph nodes, no easy bruising , no unusual bleedings  Psychiatry: No suicidal ideas, no hallucinations, no beavior problems, no confusion.  No unusual/severe anxiety, no depression   Past Medical History:  Diagnosis Date  . GERD (gastroesophageal reflux disease)   . H/O headache   . HPV in female     Past Surgical History:  Procedure Laterality Date  . BREAST REDUCTION SURGERY  2004  . CESAREAN SECTION  2011  . CYST REMOVAL HAND Right 1996    Social History   Social History  . Marital status: Married    Spouse name: [name]  . Number of children: 1  .  Years of education: Associates   Occupational History  . Manager     Social History Main Topics  . Smoking status: Former Smoker    Quit date: 03/16/2003  . Smokeless tobacco: Never Used  . Alcohol use 0.0 oz/week     Comment: rarely - wine  . Drug use: No  . Sexual activity: Not on file   Other Topics Concern  . Not on file   Social History Narrative   Lives at home with spouse and daughter   Right handed.   Caffeine use: Drinks 4-6 drinks per day (soda and tea)   Associated degree      Family History  Problem Relation Age of Onset  . Migraines Mother   . Diabetes Father   . Hypertension Father   . CAD Father     CABG 3969  . High Cholesterol Father   . Migraines Father   . Stroke Father   . Stroke Other     F (age 37), GM  . Cancer - Colon Neg Hx   . Breast cancer Neg Hx        Medication List       Accurate as of 11/25/16 11:59 PM. Always use your most recent med list.          Diclofenac Potassium 50 MG Pack Take 50 mg by mouth once as needed. Take once daily as needed with headache onset.  Please take with food   eletriptan 40 MG tablet Commonly known as:  RELPAX Take 1 tablet (40 mg total) by mouth once. May repeat in 2 hours if headache persists or recurs.   levonorgestrel 20 MCG/24HR IUD Commonly known as:  MIRENA 1 each by Intrauterine route once.          Objective:   Physical Exam BP 118/74 (BP Location: Left Arm, Patient Position: Sitting, Cuff Size: Normal)   Pulse 83   Temp 98.2 F (36.8 C) (Oral)   Resp 14   Ht 5\' 8"  (1.727 m)   Wt 254 lb 6 oz (115.4 kg)   SpO2 98%   BMI 38.68 kg/m   General:   Well developed, well nourished . NAD.  Neck: No  thyromegaly . No TTP and full range of motion HEENT:  Normocephalic . Face symmetric, atraumatic Lungs:  CTA B Normal respiratory effort, no intercostal retractions, no accessory muscle use. Heart: RRR,  no murmur.  No pretibial edema bilaterally  Abdomen:  Not  distended, soft, non-tender. No rebound or rigidity.   Skin: Exposed areas without rash. Not pale. Not jaundice Neurologic:  alert & oriented X3.  Speech normal, gait appropriate for age and unassisted Strength symmetric and appropriate for age.  DTRs symmetric Psych: Cognition and judgment appear intact.  Cooperative with normal attention span and concentration.  Behavior appropriate. No anxious or depressed appearing.    Assessment & Plan:   Assessment GERD HPV History of abnormal chest x-ray History of migraines Obesity, BMI 38  Plan: H/o  abnormal chest x-ray: We'll do x-ray with nipple marker History of migraines: Saw neurology, current strategy is ibuprofen as needed. If that fails she goes for relpax- diclofenac w/ good results. Neck pain as described above, no exam normal, stretching discussed. RTC one or 2 years

## 2016-11-25 NOTE — Progress Notes (Signed)
Pre visit review using our clinic review tool, if applicable. No additional management support is needed unless otherwise documented below in the visit note. 

## 2016-11-26 DIAGNOSIS — R911 Solitary pulmonary nodule: Secondary | ICD-10-CM | POA: Insufficient documentation

## 2016-11-26 DIAGNOSIS — Z09 Encounter for follow-up examination after completed treatment for conditions other than malignant neoplasm: Secondary | ICD-10-CM | POA: Insufficient documentation

## 2016-11-26 NOTE — Assessment & Plan Note (Signed)
H/o  abnormal chest x-ray: We'll do x-ray with nipple marker History of migraines: Saw neurology, current strategy is ibuprofen as needed. If that fails she goes for relpax- diclofenac w/ good results. Neck pain as described above, no exam normal, stretching discussed. RTC one or 2 years

## 2016-11-28 ENCOUNTER — Other Ambulatory Visit: Payer: Self-pay

## 2016-11-28 DIAGNOSIS — R911 Solitary pulmonary nodule: Secondary | ICD-10-CM

## 2016-11-29 ENCOUNTER — Other Ambulatory Visit (INDEPENDENT_AMBULATORY_CARE_PROVIDER_SITE_OTHER): Payer: 59

## 2016-11-29 DIAGNOSIS — Z0000 Encounter for general adult medical examination without abnormal findings: Secondary | ICD-10-CM | POA: Diagnosis not present

## 2016-11-29 LAB — COMPREHENSIVE METABOLIC PANEL
ALT: 16 U/L (ref 0–35)
AST: 15 U/L (ref 0–37)
Albumin: 4.3 g/dL (ref 3.5–5.2)
Alkaline Phosphatase: 33 U/L — ABNORMAL LOW (ref 39–117)
BUN: 9 mg/dL (ref 6–23)
CHLORIDE: 106 meq/L (ref 96–112)
CO2: 30 meq/L (ref 19–32)
Calcium: 9.2 mg/dL (ref 8.4–10.5)
Creatinine, Ser: 0.8 mg/dL (ref 0.40–1.20)
GFR: 85.68 mL/min (ref >60.00)
GLUCOSE: 102 mg/dL — AB (ref 70–99)
POTASSIUM: 4.3 meq/L (ref 3.5–5.1)
SODIUM: 142 meq/L (ref 135–145)
Total Bilirubin: 0.4 mg/dL (ref 0.2–1.2)
Total Protein: 6.9 g/dL (ref 6.0–8.3)

## 2016-11-29 LAB — LIPID PANEL
CHOLESTEROL: 167 mg/dL (ref 0–200)
HDL: 56.3 mg/dL (ref >39.00)
LDL Cholesterol: 100 mg/dL — ABNORMAL HIGH (ref 0–99)
NonHDL: 110.55
Total CHOL/HDL Ratio: 3
Triglycerides: 51 mg/dL (ref 0.0–149.0)
VLDL: 10.2 mg/dL (ref 0.0–40.0)

## 2016-11-29 LAB — CBC WITH DIFFERENTIAL/PLATELET
BASOS PCT: 0.5 % (ref 0.0–3.0)
Basophils Absolute: 0 10*3/uL (ref 0.0–0.1)
EOS PCT: 2.2 % (ref 0.0–5.0)
Eosinophils Absolute: 0.2 10*3/uL (ref 0.0–0.7)
HCT: 38.5 % (ref 36.0–46.0)
HEMOGLOBIN: 13.2 g/dL (ref 12.0–15.0)
LYMPHS ABS: 2.6 10*3/uL (ref 0.7–4.0)
Lymphocytes Relative: 36.3 % (ref 12.0–46.0)
MCHC: 34.2 g/dL (ref 30.0–36.0)
MCV: 85.2 fl (ref 78.0–100.0)
MONO ABS: 0.4 10*3/uL (ref 0.1–1.0)
Monocytes Relative: 5.7 % (ref 3.0–12.0)
NEUTROS ABS: 4 10*3/uL (ref 1.4–7.7)
NEUTROS PCT: 55.3 % (ref 43.0–77.0)
PLATELETS: 345 10*3/uL (ref 150.0–400.0)
RBC: 4.52 Mil/uL (ref 3.87–5.11)
RDW: 13 % (ref 11.5–15.5)
WBC: 7.2 10*3/uL (ref 4.0–10.5)

## 2016-11-29 LAB — TSH: TSH: 1.57 u[IU]/mL (ref 0.35–4.50)

## 2016-11-30 ENCOUNTER — Ambulatory Visit (HOSPITAL_BASED_OUTPATIENT_CLINIC_OR_DEPARTMENT_OTHER): Admission: RE | Admit: 2016-11-30 | Payer: 59 | Source: Ambulatory Visit

## 2016-12-02 ENCOUNTER — Ambulatory Visit (HOSPITAL_BASED_OUTPATIENT_CLINIC_OR_DEPARTMENT_OTHER)
Admission: RE | Admit: 2016-12-02 | Discharge: 2016-12-02 | Disposition: A | Discharge status: Home / Self Care | Payer: 59 | Source: Ambulatory Visit | Attending: Internal Medicine | Admitting: Internal Medicine

## 2016-12-02 DIAGNOSIS — R918 Other nonspecific abnormal finding of lung field: Secondary | ICD-10-CM | POA: Diagnosis not present

## 2016-12-02 DIAGNOSIS — R911 Solitary pulmonary nodule: Secondary | ICD-10-CM | POA: Diagnosis not present

## 2016-12-02 MED ORDER — IOPAMIDOL (ISOVUE-300) INJECTION 61%
100.0000 mL | Freq: Once | INTRAVENOUS | Status: AC | PRN
  Administered 2016-12-02: 80 mL via INTRAVENOUS

## 2016-12-03 ENCOUNTER — Telehealth: Payer: Self-pay

## 2016-12-03 DIAGNOSIS — R911 Solitary pulmonary nodule: Secondary | ICD-10-CM

## 2016-12-03 NOTE — Telephone Encounter (Signed)
[facility] chest showing 10mm nodule in right lower lobe that corresponds with chest x-ray. Consider in 3 months: a) repeat chest CT, b) PET scan, c) tissue biopsy/sampling. If questions, return number is [phone].

## 2016-12-03 NOTE — Telephone Encounter (Signed)
Urgent pulmonary referral placed.

## 2016-12-03 NOTE — Telephone Encounter (Signed)
Report reviewed, d/w pt, plan: refer to pulmonary, please enter order. Pt knows to call us w/ questions-concerns

## 2016-12-06 ENCOUNTER — Telehealth: Payer: Self-pay | Admitting: Pulmonary Disease

## 2016-12-06 ENCOUNTER — Ambulatory Visit (INDEPENDENT_AMBULATORY_CARE_PROVIDER_SITE_OTHER): Payer: 59 | Admitting: Pulmonary Disease

## 2016-12-06 ENCOUNTER — Encounter: Payer: Self-pay | Admitting: Pulmonary Disease

## 2016-12-06 VITALS — BP 134/78 | HR 78 | Ht 68.0 in | Wt 255.0 lb

## 2016-12-06 DIAGNOSIS — IMO0001 Reserved for inherently not codable concepts without codable children: Secondary | ICD-10-CM

## 2016-12-06 DIAGNOSIS — R911 Solitary pulmonary nodule: Secondary | ICD-10-CM

## 2016-12-06 NOTE — Progress Notes (Signed)
Subjective:    Patient ID: Anna Cabrera, female    DOB: 01/07/1981, 35 y.o.   MRN: [id number]  HPI The patient was found to have a lung nodule in 2015 on chest X-ray imaging. She had a repeat chest X-ray that again showed the nodule and resulted in a CT scan of her chest. She reports during the initial chest X-ray she was ill and doesn't recall her specific respiratory problems. She reports no recent dyspnea. She reports she has had a recent cough that is resolving from an upper respiratory illness. No recent sinus congestion or pressure. This resolved a couple of weeks ago. No chest pain, tightness, or pressure. No adenopathy in her neck, groin, or axilla. No weight loss. She reports chronic intermittent headaches. No focal vision loss, weakness, numbness or tingling.  Review of Systems No abdominal pain, nausea, or emesis. No rashes or abnormal bruising. A pertinent 14 point review of systems is negative except as per the history of presenting illness.  Allergies  Allergen Reactions  . Maxidone [Hydrocodone-Acetaminophen] Nausea And Vomiting  . Ortho Evra [Norelgestromin-Eth Estradiol]     Current Outpatient Prescriptions on File Prior to Visit  Medication Sig Dispense Refill  . Diclofenac Potassium 50 MG PACK Take 50 mg by mouth once as needed. Take once daily as needed with headache onset. Please take with food 9 each 6  . eletriptan (RELPAX) 40 MG tablet Take 1 tablet (40 mg total) by mouth once. May repeat in 2 hours if headache persists or recurs. 10 tablet 6  . levonorgestrel (MIRENA) 20 MCG/24HR IUD 1 each by Intrauterine route once.       No current facility-administered medications on file prior to visit.     Past Medical History:  Diagnosis Date  . GERD (gastroesophageal reflux disease)   . H/O headache   . HPV in female     Past Surgical History:  Procedure Laterality Date  . BREAST REDUCTION SURGERY  2004  . CESAREAN SECTION  2011  . CYST REMOVAL HAND Right  1996    Family History  Problem Relation Age of Onset  . Migraines Mother   . Diabetes Father   . Hypertension Father   . CAD Father     CABG 4969  . High Cholesterol Father   . Migraines Father   . Stroke Father   . Stroke Paternal Grandmother   . Cancer - Colon Neg Hx   . Breast cancer Neg Hx   . Cancer Neg Hx     Social History   Social History  . Marital status: Married    Spouse name: [name]  . Number of children: 1  . Years of education: Associates   Occupational History  . Manager  [employer]   Social History Main Topics  . Smoking status: Former Smoker    Packs/day: 1.00    Years: 7.00    Types: Cigarettes    Start date: 09/10/1996    Quit date: 03/16/2003  . Smokeless tobacco: Never Used  . Alcohol use 0.0 oz/week     Comment: rarely - wine  . Drug use: No  . Sexual activity: Not Asked   Other Topics Concern  . None   Social History Narrative   Lives at home with spouse and daughter   Right handed.   Caffeine use: Drinks 4-6 drinks per day (soda and tea)   Associated degree       [facility]:   Originally from [place]. Has  always lived in [place]. [place] to [place], [place], [place], [place], [place], [place]. 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 No known TB exposure. Always had a negative PPD test. Has a dog currently. No bird or mold. She has prior exposure to a hot tub appoximately 1 year ago. Enjoys reading.       Objective:   Physical Exam BP 134/78 (BP Location: Left Arm, Cuff Size: Normal)   Pulse 78   Ht 5\' 8"  (1.727 m)   Wt 255 lb (115.7 kg)   SpO2 98%   BMI 38.77 kg/m  General:  Awake. Alert. No distress. Integument:  Warm & dry. No rash on exposed skin.  Lymphatics:  No appreciated cervical or supraclavicular lymphadenoapthy. HEENT:  Moist mucus membranes. No oral ulcers. No scleral injection or icterus.  Cardiovascular:  Regular rate. No edema. No appreciable JVD.  Pulmonary:  Good aeration & clear to  auscultation bilaterally. Symmetric chest wall expansion. No accessory muscle use. Abdomen: Soft. Normal bowel sounds. Protuberant. Grossly nontender. Musculoskeletal:  Normal bulk and tone. Hand grip strength 5/5 bilaterally. No joint deformity or effusion appreciated. Neurological:  CN 2-12 grossly in tact. No meningismus. Moving all 4 extremities equally. Symmetric brachioradialis deep tendon reflexes. Psychiatric:  Mood and affect congruent. Speech normal rhythm, rate & tone.   IMAGING CT CHEST W/ CONTRAST 12/02/16 (personally reviewed by me): 10 mm somewhat rounded nodule within right lower lobe. No other nodule or opacity appreciated. Nodule appears to have been present going back on chest x-ray imaging to May 2015 and measures approximately the same size comparing that image with most recent chest x-ray image. No pleural effusion or thickening. No pericardial effusion. No pathologic mediastinal adenopathy.    Assessment & Plan:  35 y.o. female with incidentally found right lower lobe nodule. I personally reviewed the patient's x-ray and chest CT imaging with her today. Nodule appears to be same morphology on x-ray imaging and same size as well. We discussed her low risk of malignancy given her family history, personal history, and appearance of stability over 2 years on chest x-ray imaging. However, without documented morphology stability on CT imaging I cannot completely rule out the possibility of malignancy. We discussed continuing chest imaging to monitor as well as surgical lung biopsy and removal depending upon her level of concern. She is going to discuss this further with her husband and family and then let me know what her thoughts and decision is.  1. Lung Nodule: Patient to notify me regarding her wishes for further imaging versus lung biopsy. 2. Follow-up: Patient to return to clinic in 6 weeks.  Donna ChristenJennings E. Jamison NeighborNestor, M.D. [facility] Pager:   [phone] After 3pm or if no response, call [phone] 5:53 PM 12/06/16

## 2016-12-06 NOTE — Telephone Encounter (Signed)
IMAGING CT CHEST W/ CONTRAST 12/02/16 (personally reviewed by me): 10 mm somewhat rounded nodule within right lower lobe. No other nodule or opacity appreciated. Nodule appears to have been present going back on chest x-ray imaging to May 2015 and measures approximately the same size comparing that image with most recent chest x-ray image. No pleural effusion or thickening. No pericardial effusion. No pathologic mediastinal adenopathy.

## 2016-12-06 NOTE — Patient Instructions (Signed)
   Call or e-mail me once you decide on what you want to do about the lung nodule.  I will see you back in 6 weeks or we can adjust this appointment depending on your decision.

## 2017-01-29 ENCOUNTER — Ambulatory Visit: Payer: 59 | Admitting: Pulmonary Disease

## 2017-07-04 DIAGNOSIS — Z01419 Encounter for gynecological examination (general) (routine) without abnormal findings: Secondary | ICD-10-CM | POA: Diagnosis not present

## 2017-07-04 DIAGNOSIS — Z6838 Body mass index (BMI) 38.0-38.9, adult: Secondary | ICD-10-CM | POA: Diagnosis not present

## 2017-07-04 LAB — HM PAP SMEAR

## 2017-09-27 DIAGNOSIS — H5213 Myopia, bilateral: Secondary | ICD-10-CM | POA: Diagnosis not present

## 2017-10-27 ENCOUNTER — Encounter: Payer: Self-pay | Admitting: Internal Medicine

## 2018-06-03 ENCOUNTER — Encounter: Payer: Self-pay | Admitting: Internal Medicine

## 2018-06-30 ENCOUNTER — Encounter: Payer: Self-pay | Admitting: Internal Medicine

## 2018-06-30 ENCOUNTER — Ambulatory Visit (INDEPENDENT_AMBULATORY_CARE_PROVIDER_SITE_OTHER): Payer: No Typology Code available for payment source | Admitting: Internal Medicine

## 2018-06-30 VITALS — BP 126/72 | HR 72 | Temp 98.2°F | Resp 16 | Ht 68.0 in | Wt 235.5 lb

## 2018-06-30 DIAGNOSIS — Z0000 Encounter for general adult medical examination without abnormal findings: Secondary | ICD-10-CM | POA: Diagnosis not present

## 2018-06-30 DIAGNOSIS — Z8249 Family history of ischemic heart disease and other diseases of the circulatory system: Secondary | ICD-10-CM | POA: Diagnosis not present

## 2018-06-30 DIAGNOSIS — Z114 Encounter for screening for human immunodeficiency virus [HIV]: Secondary | ICD-10-CM

## 2018-06-30 LAB — COMPREHENSIVE METABOLIC PANEL
ALBUMIN: 4.2 g/dL (ref 3.5–5.2)
ALT: 11 U/L (ref 0–35)
AST: 9 U/L (ref 0–37)
Alkaline Phosphatase: 27 U/L — ABNORMAL LOW (ref 39–117)
BUN: 8 mg/dL (ref 6–23)
CO2: 27 mEq/L (ref 19–32)
Calcium: 9.1 mg/dL (ref 8.4–10.5)
Chloride: 107 mEq/L (ref 96–112)
Creatinine, Ser: 0.72 mg/dL (ref 0.40–1.20)
GFR: 95.94 mL/min (ref >60.00)
Glucose, Bld: 99 mg/dL (ref 70–99)
POTASSIUM: 4 meq/L (ref 3.5–5.1)
SODIUM: 139 meq/L (ref 135–145)
TOTAL PROTEIN: 6.3 g/dL (ref 6.0–8.3)
Total Bilirubin: 0.5 mg/dL (ref 0.2–1.2)

## 2018-06-30 LAB — CBC WITH DIFFERENTIAL/PLATELET
BASOS PCT: 0.8 % (ref 0.0–3.0)
Basophils Absolute: 0 10*3/uL (ref 0.0–0.1)
EOS PCT: 1.7 % (ref 0.0–5.0)
Eosinophils Absolute: 0.1 10*3/uL (ref 0.0–0.7)
HCT: 37.5 % (ref 36.0–46.0)
Hemoglobin: 12.6 g/dL (ref 12.0–15.0)
LYMPHS ABS: 1.9 10*3/uL (ref 0.7–4.0)
Lymphocytes Relative: 34 % (ref 12.0–46.0)
MCHC: 33.5 g/dL (ref 30.0–36.0)
MCV: 86.5 fl (ref 78.0–100.0)
MONO ABS: 0.3 10*3/uL (ref 0.1–1.0)
MONOS PCT: 5.8 % (ref 3.0–12.0)
NEUTROS ABS: 3.3 10*3/uL (ref 1.4–7.7)
NEUTROS PCT: 57.7 % (ref 43.0–77.0)
PLATELETS: 267 10*3/uL (ref 150.0–400.0)
RBC: 4.33 Mil/uL (ref 3.87–5.11)
RDW: 12.8 % (ref 11.5–15.5)
WBC: 5.7 10*3/uL (ref 4.0–10.5)

## 2018-06-30 LAB — HEMOGLOBIN A1C: HEMOGLOBIN A1C: 5.2 % (ref 4.6–6.5)

## 2018-06-30 LAB — LIPID PANEL
CHOLESTEROL: 132 mg/dL (ref 0–200)
HDL: 49.4 mg/dL (ref >39.00)
LDL CALC: 71 mg/dL (ref 0–99)
NonHDL: 82.55
Total CHOL/HDL Ratio: 3
Triglycerides: 59 mg/dL (ref 0.0–149.0)
VLDL: 11.8 mg/dL (ref 0.0–40.0)

## 2018-06-30 LAB — TSH: TSH: 2.4 u[IU]/mL (ref 0.35–4.50)

## 2018-06-30 NOTE — Progress Notes (Signed)
Subjective:    Patient ID: Anna Cabrera, female    DOB: 1981-12-20, 37 y.o.   MRN: [id number]  DOS:  06/30/2018 Type of visit - description : CPX  interval history: In general feeling well. In the last few months, has changed her diet and increase her exercise, has lost weight. Every summer  skin by her eyes gets darker, denies any itching or scaliness. Complain of occasionally left knee pain, occasionally it feels like it gives up.  No swelling or warmness. Knees occasionally "pop ".   Review of Systems  Other than above, a 14 point review of systems is negative      Past Medical History:  Diagnosis Date  . GERD (gastroesophageal reflux disease)   . H/O headache   . HPV in female     Past Surgical History:  Procedure Laterality Date  . BREAST REDUCTION SURGERY  2004  . CESAREAN SECTION  2011  . CYST REMOVAL HAND Right 1996    Social History   Socioeconomic History  . Marital status: Married    Spouse name: [name]  . Number of children: 1  . Years of education: Associates  . Highest education level: Not on file  Occupational History  . Occupation: Clinical cytogeneticist: [employer]  Social Needs  . Financial resource strain: Not on file  . Food insecurity:    Worry: Not on file    Inability: Not on file  . Transportation needs:    Medical: Not on file    Non-medical: Not on file  Tobacco Use  . Smoking status: Former Smoker    Packs/day: 1.00    Years: 7.00    Pack years: 7.00    Types: Cigarettes    Start date: 09/10/1996    Last attempt to quit: 03/16/2003    Years since quitting: 15.3  . Smokeless tobacco: Never Used  Substance and Sexual Activity  . Alcohol use: Yes    Alcohol/week: 0.0 oz    Comment: rarely - wine  . Drug use: No  . Sexual activity: Not on file  Lifestyle  . Physical activity:    Days per week: Not on file    Minutes per session: Not on file  . Stress: Not on file  Relationships  . Social connections:    Talks on  phone: Not on file    Gets together: Not on file    Attends religious service: Not on file    Active member of club or organization: Not on file    Attends meetings of clubs or organizations: Not on file    Relationship status: Not on file  . Intimate partner violence:    Fear of current or ex partner: Not on file    Emotionally abused: Not on file    Physically abused: Not on file    Forced sexual activity: Not on file  Other Topics Concern  . Not on file  Social History Narrative   Lives at home with spouse and daughter   Right handed.   Caffeine use: Drinks 4-6 drinks per day (soda and tea)   Associated degree       [facility]:   Originally from [place]. Has always lived in [place]. Previously has traveled to [place], [place], [place], [place], [place], [place]. 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 Always had a negative PPD test. Has a dog currently. No bird or mold.  She has prior exposure to a hot tub appoximately 1 year ago. Enjoys reading.      Family History  Problem Relation Age of Onset  . Migraines Mother   . Cardiomyopathy Mother 3964  . Diabetes Father   . Hypertension Father   . CAD Father        CABG 5169  . High Cholesterol Father   . Migraines Father   . Stroke Father   . Stroke Paternal Grandmother   . Cancer - Colon Neg Hx   . Breast cancer Neg Hx   . Cancer Neg Hx      Allergies as of 06/30/2018      Reactions   Maxidone [hydrocodone-acetaminophen] Nausea And Vomiting   Ortho Evra [norelgestromin-eth Estradiol]       Medication List        Accurate as of 06/30/18  5:15 PM. Always use your most recent med list.          Diclofenac Potassium 50 MG Pack Take 50 mg by mouth once as needed. Take once daily as needed with headache onset. Please take with food   eletriptan 40 MG tablet Commonly known as:  RELPAX Take 1 tablet (40 mg total) by mouth once. May repeat in 2 hours if headache persists or recurs.     ibuprofen 400 MG tablet Commonly known as:  ADVIL,MOTRIN Take 400 mg by mouth as needed.   levonorgestrel 20 MCG/24HR IUD Commonly known as:  MIRENA 1 each by Intrauterine route once.          Objective:   Physical Exam  HENT:  Head:     BP 126/72 (BP Location: Left Arm, Patient Position: Sitting, Cuff Size: Normal)   Pulse 72   Temp 98.2 F (36.8 C) (Oral)   Resp 16   Ht 5\' 8"  (1.727 m)   Wt 235 lb 8 oz (106.8 kg)   SpO2 98%   BMI 35.81 kg/m  General: Well developed, NAD, see BMI.  Neck: No  thyromegaly  HEENT:  Normocephalic . Face symmetric, atraumatic Lungs:  CTA B Normal respiratory effort, no intercostal retractions, no accessory muscle use. Heart: RRR,  no murmur.  No pretibial edema bilaterally  Abdomen:  Not distended, soft, non-tender. No rebound or rigidity.   Skin: Exposed areas without rash. Not pale. Not jaundice MSK: Knees symmetric, no swelling, redness, deformities.  Stable on exam. Neurologic:  alert & oriented X3.  Speech normal, gait appropriate for age and unassisted Strength symmetric and appropriate for age.  Psych: Cognition and judgment appear intact.  Cooperative with normal attention span and concentration.  Behavior appropriate. No anxious or depressed appearing.     Assessment & Plan:    Assessment GERD HPV History of abnormal chest x-ray History of migraines Obesity, BMI 38 IUD  Plan: Lung nodule: Seen by pulmonary in 2017, was recommended a follow-up CT because although nodule was likely benign they could not be completely certain.  Patient elected then not to pursue x-rays, today I offered her a CT or even a chest x-ray, she declined. Knee pain: Exam benign, recommend to continue losing weight, try to continue active without putting much pressure on her knees, stationary bike?. FH hypertrophic cardiomyopathy: Mother diagnosed at age 39, patient is essentially asx, exercises without DOE.  EKG is essentially normal,  get a echocardiogram. RTC 1 year

## 2018-06-30 NOTE — Progress Notes (Signed)
Pre visit review using our clinic review tool, if applicable. No additional management support is needed unless otherwise documented below in the visit note. 

## 2018-06-30 NOTE — Assessment & Plan Note (Addendum)
Td 2012  CCS- never had a cscope  Female care, per gyn  Labs : CMP, FLP, CBC, A1c, TSH , hiv Diet and exercise discussed

## 2018-06-30 NOTE — Assessment & Plan Note (Signed)
Lung nodule: Seen by pulmonary in 2017, was recommended a follow-up CT because although nodule was likely benign they could not be completely certain.  Patient elected then not to pursue x-rays, today I offered her a CT or even a chest x-ray, she declined. Knee pain: Exam benign, recommend to continue losing weight, try to continue active without putting much pressure on her knees, stationary bike?. FH hypertrophic cardiomyopathy: Mother diagnosed at age 39, patient is essentially asx, exercises without DOE.  EKG is essentially normal, get a echocardiogram. RTC 1 year

## 2018-06-30 NOTE — Patient Instructions (Signed)
GO TO THE LAB : Get the blood work     GO TO THE FRONT DESK Schedule your next appointment for a  Physical exam in 1 year 

## 2018-07-01 LAB — HIV ANTIBODY (ROUTINE TESTING W REFLEX): HIV 1&2 Ab, 4th Generation: NONREACTIVE

## 2018-07-03 ENCOUNTER — Other Ambulatory Visit: Payer: Self-pay

## 2018-07-03 ENCOUNTER — Ambulatory Visit (HOSPITAL_COMMUNITY): Payer: No Typology Code available for payment source | Attending: Cardiology

## 2018-07-03 DIAGNOSIS — Z8249 Family history of ischemic heart disease and other diseases of the circulatory system: Secondary | ICD-10-CM

## 2018-07-03 DIAGNOSIS — K219 Gastro-esophageal reflux disease without esophagitis: Secondary | ICD-10-CM | POA: Insufficient documentation

## 2018-07-09 ENCOUNTER — Encounter: Payer: Self-pay | Admitting: Internal Medicine

## 2018-07-29 LAB — HM PAP SMEAR: HM Pap smear: NEGATIVE

## 2019-03-08 ENCOUNTER — Ambulatory Visit: Payer: No Typology Code available for payment source | Admitting: Internal Medicine

## 2019-05-03 ENCOUNTER — Encounter: Payer: Self-pay | Admitting: Internal Medicine

## 2019-05-03 ENCOUNTER — Other Ambulatory Visit: Payer: Self-pay

## 2019-05-03 ENCOUNTER — Ambulatory Visit (INDEPENDENT_AMBULATORY_CARE_PROVIDER_SITE_OTHER): Payer: No Typology Code available for payment source | Admitting: Internal Medicine

## 2019-05-03 DIAGNOSIS — M5412 Radiculopathy, cervical region: Secondary | ICD-10-CM

## 2019-05-03 MED ORDER — CYCLOBENZAPRINE HCL 10 MG PO TABS: 10.0000 mg | ORAL_TABLET | Freq: Every evening | ORAL | 0 refills | Status: DC | PRN

## 2019-05-03 MED ORDER — PREDNISONE 10 MG PO TABS: ORAL_TABLET | ORAL | 0 refills | Status: DC

## 2019-05-03 MED FILL — predniSONE 10 MG TABS: 10 | 8 days supply | Qty: 20 | Fill #0

## 2019-05-03 MED FILL — CYCLOBENZAPRINE HCL 10 MG T: 10 | 7 days supply | Qty: 21 | Fill #0

## 2019-05-03 NOTE — Progress Notes (Signed)
Subjective:    Patient ID: Anna Cabrera, female    DOB: August 15, 1981, 38 y.o.   MRN: [id number]  DOS:  05/03/2019 Type of visit - description: Virtual Visit via Video Note  I connected with@ on 05/04/19 at  4:00 PM EDT by a video enabled telemedicine application and verified that I am speaking with the correct person using two identifiers.   THIS ENCOUNTER IS A VIRTUAL VISIT DUE TO COVID-19 - PATIENT WAS NOT SEEN IN THE OFFICE. PATIENT HAS CONSENTED TO VIRTUAL VISIT / TELEMEDICINE VISIT   Location of patient: home  Location of provider: office  I discussed the limitations of evaluation and management by telemedicine and the availability of in person appointments. The patient expressed understanding and agreed to proceed.  History of Present Illness: Acute visit  started with pain 2 weeks ago, located at the right side of the neck, worse with certain head movements. When she rotate her head certain ways, the pain extends to the right shoulder, right arm. She also noted some numbness on the right thumb and second right finger. Occasionally, experiencing right upper extremity numbness after sleeping (she sleeps on her abdomen).  Review of Systems She denies fever chills No injury No rash No bladder or bowel incontinence Gait is normal She has occasional any right-sided low back pain associated with some radiation to the leg but that is not a new issue and is currently not present. Reports the cervical spine is not TTP.  Past Medical History:  Diagnosis Date  . GERD (gastroesophageal reflux disease)   . H/O headache   . HPV in female     Past Surgical History:  Procedure Laterality Date  . BREAST REDUCTION SURGERY  2004  . CESAREAN SECTION  2011  . CYST REMOVAL HAND Right 1996    Social History   Socioeconomic History  . Marital status: Married    Spouse name: [name]  . Number of children: 1  . Years of education: Associates  . Highest education level: Not on file   Occupational History  . Occupation: Clinical cytogeneticistManager     Employer: [employer]  Social Needs  . Financial resource strain: Not on file  . Food insecurity:    Worry: Not on file    Inability: Not on file  . Transportation needs:    Medical: Not on file    Non-medical: Not on file  Tobacco Use  . Smoking status: Former Smoker    Packs/day: 1.00    Years: 7.00    Pack years: 7.00    Types: Cigarettes    Start date: 09/10/1996    Last attempt to quit: 03/16/2003    Years since quitting: 16.1  . Smokeless tobacco: Never Used  Substance and Sexual Activity  . Alcohol use: Yes    Alcohol/week: 0.0 standard drinks    Comment: rarely - wine  . Drug use: No  . Sexual activity: Not on file  Lifestyle  . Physical activity:    Days per week: Not on file    Minutes per session: Not on file  . Stress: Not on file  Relationships  . Social connections:    Talks on phone: Not on file    Gets together: Not on file    Attends religious service: Not on file    Active member of club or organization: Not on file    Attends meetings of clubs or organizations: Not on file    Relationship status: Not on file  .  Intimate partner violence:    Fear of current or ex partner: Not on file    Emotionally abused: Not on file    Physically abused: Not on file    Forced sexual activity: Not on file  Other Topics Concern  . Not on file  Social History Narrative   Lives at home with spouse and daughter   Right handed.   Caffeine use: Drinks 4-6 drinks per day (soda and tea)   Associated degree       [facility]:   Originally from [place]. Has always lived in [place]. Previously has traveled to [place], [place], [place], [place], [place], [place]. 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 Always had a negative PPD test. Has a dog currently. No bird or mold. She has prior exposure to a hot tub appoximately 1 year ago. Enjoys reading.       Allergies as of 05/03/2019       Reactions   Maxidone [hydrocodone-acetaminophen] Nausea And Vomiting   Ortho Evra [norelgestromin-eth Estradiol]       Medication List       Accurate as of May 03, 2019 11:59 PM. If you have any questions, ask your nurse or doctor.        STOP taking these medications   Diclofenac Potassium 50 MG Pack Stopped by:  Willow Ora, MD   eletriptan 40 MG tablet Commonly known as:  Relpax Stopped by:  Willow Ora, MD     TAKE these medications   cyclobenzaprine 10 MG tablet Commonly known as:  FLEXERIL Take 1 tablet (10 mg total) by mouth at bedtime as needed for muscle spasms. Started by:  Willow Ora, MD   ibuprofen 400 MG tablet Commonly known as:  ADVIL Take 400 mg by mouth as needed.   levonorgestrel 20 MCG/24HR IUD Commonly known as:  MIRENA 1 each by Intrauterine route once.   predniSONE 10 MG tablet Commonly known as:  DELTASONE 4 tablets x 2 days, 3 tabs x 2 days, 2 tabs x 2 days, 1 tab x 2 days Started by:  Willow Ora, MD           Objective:   Physical Exam There were no vitals taken for this visit. This is a virtual video visit, patient is alert oriented x3, she moves her head with minimal limitations.  In no distress    Assessment      Assessment GERD HPV History of abnormal chest x-ray History of migraines Obesity, BMI 38 IUD  Plan: Radiculopathy: Right-sided neck pain associated with episodic right arm numbness and numbness at the right thumb and second finger. Probably a C6 radiculopathy. We discussed conservative treatment versus referred to Ortho. We agreed on: Prednisone, Flexeril, Tylenol, ibuprofen.  Will call in 2 weeks if she is not improving or if symptoms severe.  Will call if symptoms resurface.  We will send a message with detailed information. She verbalized understanding.   I discussed the assessment and treatment plan with the patient. The patient was provided an opportunity to ask questions and all were answered. The patient agreed with  the plan and demonstrated an understanding of the instructions.   The patient was advised to call back or seek an in-person evaluation if the symptoms worsen or if the condition fails to improve as anticipated.

## 2019-05-04 NOTE — Assessment & Plan Note (Signed)
Radiculopathy: Right-sided neck pain associated with episodic right arm numbness and numbness at the right thumb and second finger. Probably a C6 radiculopathy. We discussed conservative treatment versus referred to Ortho. We agreed on: Prednisone, Flexeril, Tylenol, ibuprofen.  Will call in 2 weeks if she is not improving or if symptoms severe.  Will call if symptoms resurface.  We will send a message with detailed information. She verbalized understanding.

## 2019-06-01 ENCOUNTER — Encounter: Payer: Self-pay | Admitting: Internal Medicine

## 2019-06-01 DIAGNOSIS — M5412 Radiculopathy, cervical region: Secondary | ICD-10-CM

## 2019-06-04 ENCOUNTER — Ambulatory Visit (INDEPENDENT_AMBULATORY_CARE_PROVIDER_SITE_OTHER): Payer: No Typology Code available for payment source | Admitting: Family Medicine

## 2019-06-04 ENCOUNTER — Other Ambulatory Visit: Payer: Self-pay

## 2019-06-04 ENCOUNTER — Encounter: Payer: Self-pay | Admitting: Family Medicine

## 2019-06-04 ENCOUNTER — Ambulatory Visit (INDEPENDENT_AMBULATORY_CARE_PROVIDER_SITE_OTHER): Payer: No Typology Code available for payment source

## 2019-06-04 DIAGNOSIS — M542 Cervicalgia: Secondary | ICD-10-CM

## 2019-06-04 NOTE — Progress Notes (Signed)
Office Visit Note   Patient: Anna Cabrera           Date of Birth: Jun 27, 1981           MRN: [id number] Visit Date: 06/04/2019 Requested by: Wanda PlumpPaz, Jose E, MD [address] [place],  [place] [id number] PCP: Wanda PlumpPaz, Jose E, MD  Subjective: Chief Complaint  Patient presents with  . Neck - Pain    Intermittent pain x 1 year. Pain worsened a few weeks ago. Pain radiates down right arm, with numbness/tingling the hand. Constant aching pain in the arm. Right-hand dominant.    HPI: She is a 38 year old right-hand-dominant female with neck and right arm pain.  Symptoms started about a year ago on a mild basis, but severe about a month ago.  No injury to cause this.  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  She has now started getting numbness in her arm intermittently, even at night when sleeping.  She has not noticed any weakness yet.  She tried a prednisone taper a few weeks ago but it only gave temporary relief.               ROS: No fevers or chills.  All other systems were reviewed and are negative.  Objective: Vital Signs: There were no vitals taken for this visit.  Physical Exam:  General:  Alert and oriented, in no acute distress. Pulm:  Breathing unlabored. Psy:  Normal mood, congruent affect. Skin: No visible rash. Neck: Full range of motion symmetrically.  Spurling's test is equivocal.  Mild tenderness to the right of midline near the C6-7 level, tender trigger point in the trapezius belly.  Another one in the rhomboid area but it does not reproduce her pain.  Upper extremity strength and reflexes are normal.  Imaging: X-ray cervical spine: She has straightening of the cervical spine with moderate degenerative disc disease at C6-7.  There is mild uncovertebral joint DJD at a couple levels.    Assessment & Plan: 1.  Neck and right arm pain concerning for cervical  radiculopathy, neurologic exam is nonfocal. -Trial of physical therapy.  If not improving then MRI scan.  She did not want any new medications.     Procedures: No procedures performed  No notes on file     PMFS History: Patient Active Problem List   Diagnosis Date Noted  . PCP NOTES >>>>>> 11/26/2016  . Lung nodule 11/26/2016  . Annual physical exam 11/25/2016  . Migraine without aura and with status migrainosus, not intractable 04/18/2015  . Back pain 12/31/2014  . BMI 37.0-37.9,adult    Past Medical History:  Diagnosis Date  . GERD (gastroesophageal reflux disease)   . H/O headache   . HPV in female     Family History  Problem Relation Age of Onset  . Migraines Mother   . Cardiomyopathy Mother 4264  . Diabetes Father   . Hypertension Father   . CAD Father        CABG 5169  . High Cholesterol Father   . Migraines Father   . Stroke Father   . Stroke Paternal Grandmother   . Cancer - Colon Neg Hx   . Breast cancer Neg Hx   . Cancer Neg Hx     Past Surgical History:  Procedure Laterality Date  . BREAST REDUCTION SURGERY  2004  . CESAREAN SECTION  2011  . CYST REMOVAL HAND Right 1996   Social History   Occupational History  . Occupation: Programme researcher, broadcasting/film/video: [employer]  Tobacco Use  . Smoking status: Former Smoker    Packs/day: 1.00    Years: 7.00    Pack years: 7.00    Types: Cigarettes    Start date: 09/10/1996    Quit date: 03/16/2003    Years since quitting: 16.2  . Smokeless tobacco: Never Used  Substance and Sexual Activity  . Alcohol use: Yes    Alcohol/week: 0.0 standard drinks    Comment: rarely - wine  . Drug use: No  . Sexual activity: Not on file

## 2019-06-05 ENCOUNTER — Encounter: Payer: Self-pay | Admitting: Internal Medicine

## 2019-06-17 ENCOUNTER — Encounter: Payer: Self-pay | Admitting: Physical Therapy

## 2019-06-17 ENCOUNTER — Ambulatory Visit: Payer: No Typology Code available for payment source | Attending: Family Medicine | Admitting: Physical Therapy

## 2019-06-17 ENCOUNTER — Other Ambulatory Visit: Payer: Self-pay

## 2019-06-17 DIAGNOSIS — M542 Cervicalgia: Secondary | ICD-10-CM | POA: Insufficient documentation

## 2019-06-17 DIAGNOSIS — M62838 Other muscle spasm: Secondary | ICD-10-CM | POA: Insufficient documentation

## 2019-06-17 DIAGNOSIS — M5412 Radiculopathy, cervical region: Secondary | ICD-10-CM | POA: Insufficient documentation

## 2019-06-17 NOTE — Therapy (Signed)
[place], [place], [address] Phone: [phone]   Fax:  [phone]  Physical Therapy Evaluation  Patient Details  Name: Anna Cabrera MRN: [id number] Date of Birth: May 02, 1981 Referring Provider (PT): hilts   Encounter Date: 06/17/2019  PT End of Session - 06/17/19 1647    Visit Number  1    Number of Visits  30    Date for PT Re-Evaluation  08/17/19    PT Start Time  1608    PT Stop Time  1705    PT Time Calculation (min)  57 min    Activity Tolerance  Patient tolerated treatment well    Behavior During Therapy  [facility] for tasks assessed/performed       Past Medical History:  Diagnosis Date  . GERD (gastroesophageal reflux disease)   . H/O headache   . HPV in female     Past Surgical History:  Procedure Laterality Date  . BREAST REDUCTION SURGERY  2004  . CESAREAN SECTION  2011  . CYST REMOVAL HAND Right 1996    There were no vitals filed for this visit.   Subjective Assessment - 06/17/19 1613    Subjective  Patient reports that she has had some neck pain for over a year, she is unsure of a cause, reports that over the past month she has had increase pain and numbness in the right arm into the firts 3 fingers    Limitations  Reading;House hold activities    Patient Stated Goals  have less pain    Currently in Pain?  Yes    Pain Score  3     Pain Location  Shoulder    Pain Orientation  Right    Pain Descriptors / Indicators  Spasm;Tightness;Aching;Dull    Pain Type  Acute pain    Pain Radiating Towards  numbness into the 1st 3 fingers, pain and ache in the right upper arm to the elbow    Pain Onset  More than a month ago    Pain Frequency  Constant    Aggravating Factors   head motions, leaning forward, at desk pain up to 10/10    Pain Relieving Factors  massage, heat at best pain a 2-3/10    Effect of Pain on Daily Activities  just really aches, hard to work, sleep and  turn head         [facility] PT Assessment - 06/17/19 0001      Assessment   Medical Diagnosis  neck pain with right radiculopathy    Referring Provider (PT)  hilts    Onset Date/Surgical Date  05/17/19    Hand Dominance  Right    Prior Therapy  no      Precautions   Precautions  None      Balance Screen   Has the patient fallen in the past 6 months  No    Has the patient had a decrease in activity level because of a fear of falling?   No    Is the patient reluctant to leave their home because of a fear of falling?   No      Home Environment   Additional Comments  does housework      Prior Function   Level of Independence  Independent    Vocation  Full time employment    Vocation Requirements  mostly at computer    Leisure  would like to return to  some exercise, tiried kickboxing and liked it      Posture/Postural Control   Posture Comments  flat thoracic and cervical, rounded shoulder snd forward head      ROM / Strength   AROM / PROM / Strength  AROM;Strength      AROM   Overall AROM Comments  shoulder ROM is WFL's but with pain in the right shoulder and upper arm, cervical ROM is WFL's except for side bending decreaed 25% with c/o tightness for all motions      Strength   Overall Strength Comments  WFL's      Palpation   Palpation comment  very tight in the upper trap and the cervical area, significant knots in the upper trap                Objective measurements completed on examination: See above findings.      [facility] Adult PT Treatment/Exercise - 06/17/19 0001      Modalities   Modalities  Electrical Stimulation;Moist Heat      Moist Heat Therapy   Number Minutes Moist Heat  15 Minutes    Moist Heat Location  Cervical      Electrical Stimulation   Electrical Stimulation Location  right upper trap and cervical area    Electrical Stimulation Action  IFC    Electrical Stimulation Parameters  supine    Electrical Stimulation Goals  Pain              PT Education - 06/17/19 1646    Education Details  ergonomic set up, shrugs, scapular and cervical retraction    Person(s) Educated  Patient    Methods  Explanation;Demonstration;Handout;Tactile cues;Verbal cues    Comprehension  Verbalized understanding       PT Short Term Goals - 06/17/19 1650      PT SHORT TERM GOAL #1   Title  independent iwth initial HEP    Time  2    Period  Weeks    Status  New        PT Long Term Goals - 06/17/19 1650      PT LONG TERM GOAL #1   Title  understand posture and body mechanics    Time  8    Period  Weeks    Status  New      PT LONG TERM GOAL #2   Title  decrease pain 25%    Time  8    Period  Weeks    Status  New      PT LONG TERM GOAL #3   Title  have minimal numbness in the right hand    Time  8    Period  Weeks    Status  New      PT LONG TERM GOAL #4   Title  increase cervical ROM to WNL's    Time  8    Period  Weeks    Status  New             Plan - 06/17/19 1647    Clinical Impression Statement  Patient reports some neck pain for about a year, reports a few months ago without a known cause she started having much worse pain with right arm pain and numbness in the first 3 fingers.  X-rays showed decreased curvature and DDD at the C6-7 area.  She has significant spasms and tightness int he upper trap and the cervical area.  She is now working from home and  may need help with her desk set up    Stability/Clinical Decision Making  Evolving/Moderate complexity    Clinical Decision Making  Low    Rehab Potential  Good    PT Frequency  2x / week    PT Duration  8 weeks    PT Treatment/Interventions  ADLs/Self Care Home Management;Cryotherapy;Electrical Stimulation;Moist Heat;Traction;Ultrasound;Therapeutic exercise;Therapeutic activities;Patient/family education;Manual techniques;Dry needling    PT Next Visit Plan  start scapular stability, try traction    Consulted and Agree with Plan of Care   Patient       Patient will benefit from skilled therapeutic intervention in order to improve the following deficits and impairments:  Pain, Improper body mechanics, Increased muscle spasms, Postural dysfunction, Impaired UE functional use, Decreased range of motion, Decreased strength, Impaired flexibility  Visit Diagnosis: 1. Radiculopathy, cervical region   2. Cervicalgia   3. Other muscle spasm        Problem List Patient Active Problem List   Diagnosis Date Noted  . PCP NOTES >>>>>> 11/26/2016  . Lung nodule 11/26/2016  . Annual physical exam 11/25/2016  . Migraine without aura and with status migrainosus, not intractable 04/18/2015  . Back pain 12/31/2014  . BMI 37.0-37.9,adult     Jearld LeschALBRIGHT,Galan Ghee W., PT 06/17/2019, 4:52 PM  [facility] [address] [place], [place], [address] Phone: [phone]   Fax:  [phone]  Name: Anna Cabrera MRN: [id number] Date of Birth: Feb 08, 1981

## 2019-06-17 NOTE — Patient Instructions (Signed)

## 2019-06-21 ENCOUNTER — Ambulatory Visit: Payer: No Typology Code available for payment source | Admitting: Physical Therapy

## 2019-06-21 ENCOUNTER — Encounter: Payer: Self-pay | Admitting: Physical Therapy

## 2019-06-21 ENCOUNTER — Other Ambulatory Visit: Payer: Self-pay

## 2019-06-21 DIAGNOSIS — M62838 Other muscle spasm: Secondary | ICD-10-CM

## 2019-06-21 DIAGNOSIS — M5412 Radiculopathy, cervical region: Secondary | ICD-10-CM | POA: Diagnosis not present

## 2019-06-21 DIAGNOSIS — M542 Cervicalgia: Secondary | ICD-10-CM

## 2019-06-21 NOTE — Therapy (Signed)
[place], [place], [address] Phone: [phone]   Fax:  [phone]  Physical Therapy Treatment  Patient Details  Name: Anna Cabrera MRN: [id number] Date of Birth: 06/30/1981 Referring Provider (PT): hilts   Encounter Date: 06/21/2019  PT End of Session - 06/21/19 8682    Visit Number  2    Number of Visits  30    Date for PT Re-Evaluation  08/17/19    PT Start Time  5749    PT Stop Time  1645    PT Time Calculation (min)  60 min    Activity Tolerance  Patient tolerated treatment well    Behavior During Therapy  [facility] for tasks assessed/performed       Past Medical History:  Diagnosis Date  . GERD (gastroesophageal reflux disease)   . H/O headache   . HPV in female     Past Surgical History:  Procedure Laterality Date  . BREAST REDUCTION SURGERY  2004  . CESAREAN SECTION  2011  . CYST REMOVAL HAND Right 1996    There were no vitals filed for this visit.  Subjective Assessment - 06/21/19 1546    Subjective  No pain, just a little achy    Currently in Pain?  No/denies    Pain Location  Shoulder    Pain Orientation  Right;Upper    Pain Descriptors / Indicators  Aching                       [facility] Adult PT Treatment/Exercise - 06/21/19 0001      Exercises   Exercises  Shoulder      Shoulder Exercises: Supine   Other Supine Exercises  cervical retractions 2x5      Shoulder Exercises: Seated   Other Seated Exercises  cervical retractions x5       Shoulder Exercises: Standing   External Rotation  Theraband;20 reps;Both;Strengthening    Theraband Level (Shoulder External Rotation)  Level 2 (Red)    Extension  Theraband;20 reps;Both    Theraband Level (Shoulder Extension)  Level 2 (Red)    Row  Theraband;20 reps;Both    Theraband Level (Shoulder Row)  Level 2 (Red)      Shoulder Exercises: ROM/Strengthening   UBE (Upper Arm Bike)  L2 x 2 min each       Modalities   Modalities  Electrical Stimulation;Moist Heat;Traction      Moist Heat Therapy   Number Minutes Moist Heat  15 Minutes    Moist Heat Location  Cervical      Electrical Stimulation   Electrical Stimulation Location  right upper trap and cervical area    Electrical Stimulation Action  IFC    Electrical Stimulation Parameters  supine    Electrical Stimulation Goals  Pain      Traction   Type of Traction  Cervical    Max (lbs)  13    Hold Time  12    Time  12      Manual Therapy   Manual Therapy  Soft tissue mobilization;Passive ROM;Manual Traction    Manual therapy comments  Some tenderness in upper R trap     Soft tissue mobilization  Posterior cervical paraspinales, uppet traps, levaters    Passive ROM  Cervical spine all directions    Manual Traction  5 x 10 sec  PT Short Term Goals - 06/21/19 1634      PT SHORT TERM GOAL #1   Title  independent iwth initial HEP    Status  Achieved        PT Long Term Goals - 06/21/19 1636      PT LONG TERM GOAL #1   Title  understand posture and body mechanics    Status  Partially Met            Plan - 06/21/19 1637    Clinical Impression Statement  Pt tolerated an initial progression to TE well. Postural cues needed with all Tband exercises. Reports a electrical sensation down her R arm with cervical retraction in seated position, this sensation was less in the supine position with head into the pillow due to the less ROM. tenderness  in the upper R trap noted with MT. No issues with the modalities.    Stability/Clinical Decision Making  Evolving/Moderate complexity    Rehab Potential  Good    PT Frequency  2x / week    PT Duration  8 weeks    PT Treatment/Interventions  ADLs/Self Care Home Management;Cryotherapy;Electrical Stimulation;Moist Heat;Traction;Ultrasound;Therapeutic exercise;Therapeutic activities;Patient/family education;Manual techniques;Dry needling    PT Next Visit Plan   start scapular stability, try traction       Patient will benefit from skilled therapeutic intervention in order to improve the following deficits and impairments:  Pain, Improper body mechanics, Increased muscle spasms, Postural dysfunction, Impaired UE functional use, Decreased range of motion, Decreased strength, Impaired flexibility  Visit Diagnosis: 1. Cervicalgia   2. Other muscle spasm        Problem List Patient Active Problem List   Diagnosis Date Noted  . PCP NOTES >>>>>> 11/26/2016  . Lung nodule 11/26/2016  . Annual physical exam 11/25/2016  . Migraine without aura and with status migrainosus, not intractable 04/18/2015  . Back pain 12/31/2014  . BMI 37.0-37.9,adult     Scot Jun, PTA 06/21/2019, 4:44 PM  [place], [place], [address] Phone: [phone]   Fax:  [phone]  Name: MAKITA BLOW MRN: [id number] Date of Birth: July 28, 1981

## 2019-06-23 ENCOUNTER — Encounter: Payer: No Typology Code available for payment source | Admitting: Physical Therapy

## 2019-06-25 ENCOUNTER — Encounter: Payer: Self-pay | Admitting: Physical Therapy

## 2019-06-25 ENCOUNTER — Other Ambulatory Visit: Payer: Self-pay

## 2019-06-25 ENCOUNTER — Ambulatory Visit: Payer: No Typology Code available for payment source | Attending: Family Medicine | Admitting: Physical Therapy

## 2019-06-25 DIAGNOSIS — M5412 Radiculopathy, cervical region: Secondary | ICD-10-CM | POA: Diagnosis present

## 2019-06-25 DIAGNOSIS — M542 Cervicalgia: Secondary | ICD-10-CM

## 2019-06-25 DIAGNOSIS — M62838 Other muscle spasm: Secondary | ICD-10-CM | POA: Diagnosis present

## 2019-06-25 NOTE — Therapy (Signed)
[place], [place], [address] Phone: [phone]   Fax:  [phone]  Physical Therapy Treatment  Patient Details  Name: Anna Cabrera MRN: [id number] Date of Birth: 1981-05-29 Referring Provider (PT): hilts   Encounter Date: 06/25/2019  PT End of Session - 06/25/19 0914    Visit Number  3    Number of Visits  30    Date for PT Re-Evaluation  08/17/19    PT Start Time  0845    PT Stop Time  0940    PT Time Calculation (min)  55 min    Activity Tolerance  Patient tolerated treatment well    Behavior During Therapy  [facility] for tasks assessed/performed       Past Medical History:  Diagnosis Date  . GERD (gastroesophageal reflux disease)   . H/O headache   . HPV in female     Past Surgical History:  Procedure Laterality Date  . BREAST REDUCTION SURGERY  2004  . CESAREAN SECTION  2011  . CYST REMOVAL HAND Right 1996    There were no vitals filed for this visit.  Subjective Assessment - 06/25/19 0845    Subjective  REports that she is having less numbness, says that she has a a tingling pain when she does the cervical retractions    Currently in Pain?  Yes    Pain Score  4     Pain Location  Neck    Pain Orientation  Right    Pain Descriptors / Indicators  Aching;Spasm;Tightness    Aggravating Factors   cervical retraction                       [facility] Adult PT Treatment/Exercise - 06/25/19 0001      Shoulder Exercises: Standing   External Rotation  Theraband;20 reps;Both;Strengthening    Theraband Level (Shoulder External Rotation)  Level 2 (Red)    Extension  Theraband;20 reps;Both    Theraband Level (Shoulder Extension)  Level 2 (Red)    Row  Theraband;20 reps;Both    Theraband Level (Shoulder Row)  Level 2 (Red)    Other Standing Exercises  weighted ball over head lift      Shoulder Exercises: ROM/Strengthening   UBE (Upper Arm Bike)  L4 x 2 min each     "W"  Arms  20      Shoulder Exercises: Stretch   Corner Stretch  3 reps;10 seconds      Modalities   Modalities  Electrical Stimulation;Moist Heat;Traction      Moist Heat Therapy   Number Minutes Moist Heat  15 Minutes    Moist Heat Location  Cervical      Electrical Stimulation   Electrical Stimulation Location  right upper trap and cervical area    Electrical Stimulation Action  IFC    Electrical Stimulation Parameters  supine    Electrical Stimulation Goals  Pain      Traction   Type of Traction  Cervical    Max (lbs)  14    Hold Time  static    Time  12      Manual Therapy   Manual Therapy  Soft tissue mobilization    Soft tissue mobilization  Posterior cervical paraspinales, uppet traps, levaters, large and very tender knot in the upper trap       Trigger Point Dry Needling - 06/25/19 0001    Consent Given?  Yes    Education Handout Provided  Yes    Muscles Treated Head and Neck  Upper trapezius    Upper Trapezius Response  Twitch reponse elicited;Palpable increased muscle length             PT Short Term Goals - 06/21/19 1634      PT SHORT TERM GOAL #1   Title  independent iwth initial HEP    Status  Achieved        PT Long Term Goals - 06/21/19 1636      PT LONG TERM GOAL #1   Title  understand posture and body mechanics    Status  Partially Met            Plan - 06/25/19 0915    Clinical Impression Statement  Patient reports that with the cervical retraction exercise she gets a shooting pain into the right arm, I had her perform and we adjusted due to her having some extension but it still caused the shooting pain, asked her to stop this.  She had a lot of LTR in the right upper trap with the DN.    PT Next Visit Plan  see how she did with the DN and stopping the cervical retraction    Consulted and Agree with Plan of Care  Patient       Patient will benefit from skilled therapeutic intervention in order to improve the following deficits  and impairments:  Pain, Improper body mechanics, Increased muscle spasms, Postural dysfunction, Impaired UE functional use, Decreased range of motion, Decreased strength, Impaired flexibility  Visit Diagnosis: 1. Cervicalgia   2. Other muscle spasm   3. Radiculopathy, cervical region        Problem List Patient Active Problem List   Diagnosis Date Noted  . PCP NOTES >>>>>> 11/26/2016  . Lung nodule 11/26/2016  . Annual physical exam 11/25/2016  . Migraine without aura and with status migrainosus, not intractable 04/18/2015  . Back pain 12/31/2014  . BMI 37.0-37.9,adult     Sumner Boast., PT 06/25/2019, 9:17 AM  [place], [place], [address] Phone: [phone]   Fax:  [phone]  Name: Anna Cabrera MRN: [id number] Date of Birth: Jan 14, 1981

## 2019-06-30 ENCOUNTER — Other Ambulatory Visit: Payer: Self-pay

## 2019-06-30 ENCOUNTER — Ambulatory Visit: Payer: No Typology Code available for payment source | Admitting: Physical Therapy

## 2019-06-30 ENCOUNTER — Encounter: Payer: Self-pay | Admitting: Physical Therapy

## 2019-06-30 DIAGNOSIS — M542 Cervicalgia: Secondary | ICD-10-CM

## 2019-06-30 DIAGNOSIS — M62838 Other muscle spasm: Secondary | ICD-10-CM

## 2019-06-30 DIAGNOSIS — M5412 Radiculopathy, cervical region: Secondary | ICD-10-CM

## 2019-06-30 NOTE — Therapy (Signed)
[place], [place], [address] Phone: [phone]   Fax:  [phone]  Physical Therapy Treatment  Patient Details  Name: MEAGAN ANCONA MRN: [id number] Date of Birth: March 11, 1981 Referring Provider (PT): hilts   Encounter Date: 06/30/2019  PT End of Session - 06/30/19 1545    Visit Number  4    Date for PT Re-Evaluation  08/17/19    PT Start Time  1519    PT Stop Time  1608    PT Time Calculation (min)  49 min    Activity Tolerance  Patient tolerated treatment well    Behavior During Therapy  [facility] for tasks assessed/performed       Past Medical History:  Diagnosis Date  . GERD (gastroesophageal reflux disease)   . H/O headache   . HPV in female     Past Surgical History:  Procedure Laterality Date  . BREAST REDUCTION SURGERY  2004  . CESAREAN SECTION  2011  . CYST REMOVAL HAND Right 1996    There were no vitals filed for this visit.  Subjective Assessment - 06/30/19 1543    Subjective  Reports that she was sore after the DN.  REports that she is feeling less stiff and having some less pain    Currently in Pain?  Yes    Pain Score  3     Pain Location  Neck    Pain Relieving Factors  "Seems what we are doing is helping"                       [facility] Adult PT Treatment/Exercise - 06/30/19 0001      Modalities   Modalities  Electrical Stimulation;Moist Heat;Traction      Moist Heat Therapy   Number Minutes Moist Heat  15 Minutes    Moist Heat Location  Cervical      Electrical Stimulation   Electrical Stimulation Location  right upper trap and cervical area    Electrical Stimulation Action  IFC    Electrical Stimulation Parameters  supine    Electrical Stimulation Goals  Pain      Traction   Type of Traction  Cervical    Max (lbs)  14    Hold Time  static    Time  12      Manual Therapy   Manual Therapy  Soft tissue mobilization    Soft tissue  mobilization  Posterior cervical paraspinales, uppet traps, levaters, large and very tender knot in the upper trap               PT Short Term Goals - 06/21/19 1634      PT SHORT TERM GOAL #1   Title  independent iwth initial HEP    Status  Achieved        PT Long Term Goals - 06/30/19 1549      PT LONG TERM GOAL #1   Title  understand posture and body mechanics    Status  Achieved            Plan - 06/30/19 1546    Clinical Impression Statement  Patient reports less numbness and some feeling better, she has less tenderness in the right upper trap and the right cervical area.    PT Next Visit Plan  will see 1x/week and see if she maintains and continues to get better    Consulted and Agree with Plan  of Care  Patient       Patient will benefit from skilled therapeutic intervention in order to improve the following deficits and impairments:  Pain, Improper body mechanics, Increased muscle spasms, Postural dysfunction, Impaired UE functional use, Decreased range of motion, Decreased strength, Impaired flexibility  Visit Diagnosis: 1. Cervicalgia   2. Other muscle spasm   3. Radiculopathy, cervical region        Problem List Patient Active Problem List   Diagnosis Date Noted  . PCP NOTES >>>>>> 11/26/2016  . Lung nodule 11/26/2016  . Annual physical exam 11/25/2016  . Migraine without aura and with status migrainosus, not intractable 04/18/2015  . Back pain 12/31/2014  . BMI 37.0-37.9,adult     Jearld LeschALBRIGHT,Verdie Wilms W., PT 06/30/2019, 3:49 PM  [facility] [address] [place], [place], [address] Phone: [phone]   Fax:  [phone]  Name: Mable Parisabitha D Gray MRN: [id number] Date of Birth: 1981/03/15

## 2019-07-02 ENCOUNTER — Encounter: Payer: No Typology Code available for payment source | Admitting: Physical Therapy

## 2019-07-02 ENCOUNTER — Ambulatory Visit: Payer: No Typology Code available for payment source | Admitting: Physical Therapy

## 2019-07-07 ENCOUNTER — Ambulatory Visit: Payer: No Typology Code available for payment source | Admitting: Physical Therapy

## 2019-07-07 ENCOUNTER — Other Ambulatory Visit: Payer: Self-pay

## 2019-07-07 ENCOUNTER — Encounter: Payer: Self-pay | Admitting: Physical Therapy

## 2019-07-07 DIAGNOSIS — M5412 Radiculopathy, cervical region: Secondary | ICD-10-CM

## 2019-07-07 DIAGNOSIS — M542 Cervicalgia: Secondary | ICD-10-CM | POA: Diagnosis not present

## 2019-07-07 DIAGNOSIS — M62838 Other muscle spasm: Secondary | ICD-10-CM

## 2019-07-07 NOTE — Therapy (Signed)
[place], [place], [address] Phone: [phone]   Fax:  [phone]  Physical Therapy Treatment  Patient Details  Name: Anna Cabrera MRN: [id number] Date of Birth: 1981/09/28 Referring Provider (PT): hilts   Encounter Date: 07/07/2019  PT End of Session - 07/07/19 1557    Visit Number  5    Number of Visits  30    Date for PT Re-Evaluation  08/17/19    PT Start Time  9470    PT Stop Time  1615    PT Time Calculation (min)  45 min    Activity Tolerance  Patient tolerated treatment well    Behavior During Therapy  [facility] for tasks assessed/performed       Past Medical History:  Diagnosis Date  . GERD (gastroesophageal reflux disease)   . H/O headache   . HPV in female     Past Surgical History:  Procedure Laterality Date  . BREAST REDUCTION SURGERY  2004  . CESAREAN SECTION  2011  . CYST REMOVAL HAND Right 1996    There were no vitals filed for this visit.  Subjective Assessment - 07/07/19 1534    Subjective  Reports having less numbenss and pain overall, some soreness, feel like I am getting better    Currently in Pain?  Yes    Pain Score  3     Pain Location  Shoulder    Pain Descriptors / Indicators  Aching    Pain Relieving Factors  treatment seems to helps                       [facility] Adult PT Treatment/Exercise - 07/07/19 0001      Modalities   Modalities  Electrical Stimulation;Moist Heat;Traction      Moist Heat Therapy   Number Minutes Moist Heat  15 Minutes    Moist Heat Location  Cervical      Electrical Stimulation   Electrical Stimulation Location  right upper trap and cervical area    Electrical Stimulation Action  IFC    Electrical Stimulation Parameters  supine    Electrical Stimulation Goals  Pain      Traction   Type of Traction  Cervical    Max (lbs)  14    Hold Time  static    Time  12      Manual Therapy   Manual Therapy   Soft tissue mobilization    Soft tissue mobilization  Posterior cervical paraspinales, uppet traps, levaters, large and very tender knot in the upper trap               PT Short Term Goals - 06/21/19 1634      PT SHORT TERM GOAL #1   Title  independent iwth initial HEP    Status  Achieved        PT Long Term Goals - 07/07/19 1559      PT LONG TERM GOAL #2   Title  decrease pain 25%    Status  Achieved      PT LONG TERM GOAL #3   Title  have minimal numbness in the right hand    Status  Partially Met      PT LONG TERM GOAL #4   Title  increase cervical ROM to WNL's    Status  Achieved            Plan - 07/07/19  1557    Clinical Impression Statement  Patient is doing better, the knot in the right upper trap is much smaller and less tender, continues to report less numbness and pain, she did try retraction again today and this again caused some of the radicular symptoms, so we agreed to not try this again.    PT Next Visit Plan  she is doing better, we will try to skip a week and see how she is doing    Consulted and Agree with Plan of Care  Patient       Patient will benefit from skilled therapeutic intervention in order to improve the following deficits and impairments:  Pain, Improper body mechanics, Increased muscle spasms, Postural dysfunction, Impaired UE functional use, Decreased range of motion, Decreased strength, Impaired flexibility  Visit Diagnosis: 1. Cervicalgia   2. Other muscle spasm   3. Radiculopathy, cervical region        Problem List Patient Active Problem List   Diagnosis Date Noted  . PCP NOTES >>>>>> 11/26/2016  . Lung nodule 11/26/2016  . Annual physical exam 11/25/2016  . Migraine without aura and with status migrainosus, not intractable 04/18/2015  . Back pain 12/31/2014  . BMI 37.0-37.9,adult     Sumner Boast., PT 07/07/2019, 4:06 PM  [place] [address] [place], [place], [address] Phone: [phone]   Fax:  [phone]  Name: Anna Cabrera MRN: [id number] Date of Birth: 04/21/1981

## 2019-10-07 LAB — HM MAMMOGRAPHY

## 2020-10-06 ENCOUNTER — Encounter: Payer: Self-pay | Admitting: Internal Medicine

## 2020-10-06 DIAGNOSIS — M542 Cervicalgia: Secondary | ICD-10-CM

## 2020-11-09 ENCOUNTER — Encounter: Payer: Self-pay | Admitting: Internal Medicine

## 2020-11-15 ENCOUNTER — Ambulatory Visit (INDEPENDENT_AMBULATORY_CARE_PROVIDER_SITE_OTHER): Payer: No Typology Code available for payment source | Admitting: Internal Medicine

## 2020-11-15 ENCOUNTER — Encounter: Payer: Self-pay | Admitting: Internal Medicine

## 2020-11-15 ENCOUNTER — Other Ambulatory Visit: Payer: Self-pay

## 2020-11-15 VITALS — BP 128/83 | HR 66 | Temp 98.0°F | Resp 16 | Ht 68.0 in | Wt 251.2 lb

## 2020-11-15 DIAGNOSIS — Z0000 Encounter for general adult medical examination without abnormal findings: Secondary | ICD-10-CM | POA: Diagnosis not present

## 2020-11-15 DIAGNOSIS — Z1159 Encounter for screening for other viral diseases: Secondary | ICD-10-CM | POA: Diagnosis not present

## 2020-11-15 NOTE — Progress Notes (Signed)
Pre visit review using our clinic review tool, if applicable. No additional management support is needed unless otherwise documented below in the visit note. 

## 2020-11-15 NOTE — Patient Instructions (Signed)
   GO TO THE LAB : Get the blood work     GO TO THE FRONT DESK, PLEASE SCHEDULE YOUR APPOINTMENTS Come back for a physical exam in 1 year 

## 2020-11-15 NOTE — Progress Notes (Signed)
Subjective:    Patient ID: Anna Cabrera, female    DOB: March 11, 1981, 39 y.o.   MRN: [id number]  DOS:  11/15/2020 Type of visit - description: CPX  Since the last visit she is doing well. She was seen last year with neck pain, subsequently saw orthopedics and a chiropractor. Orthopedist agreed with diagnosis of radiculopathy, has done physical therapy.  Pain and radiation has decreased but are not resolved.   Review of Systems  Other than above, a 14 point review of systems is negative      Past Medical History:  Diagnosis Date  . GERD (gastroesophageal reflux disease)   . H/O headache   . HPV in female     Past Surgical History:  Procedure Laterality Date  . BREAST REDUCTION SURGERY  2004  . CESAREAN SECTION  2011  . CYST REMOVAL HAND Right 1996    Allergies as of 11/15/2020      Reactions   Maxidone [hydrocodone-acetaminophen] Nausea And Vomiting   Ortho Evra [norelgestromin-eth Estradiol]       Medication List       Accurate as of November 15, 2020 11:59 PM. If you have any questions, ask your nurse or doctor.        cyclobenzaprine 10 MG tablet Commonly known as: FLEXERIL Take 1 tablet (10 mg total) by mouth at bedtime as needed for muscle spasms.   ibuprofen 400 MG tablet Commonly known as: ADVIL Take 400 mg by mouth as needed.   levonorgestrel 20 MCG/24HR IUD Commonly known as: MIRENA 1 each by Intrauterine route once.   predniSONE 10 MG tablet Commonly known as: DELTASONE 4 tablets x 2 days, 3 tabs x 2 days, 2 tabs x 2 days, 1 tab x 2 days          Objective:   Physical Exam BP 128/83 (BP Location: Right Arm, Patient Position: Sitting, Cuff Size: Normal)   Pulse 66   Temp 98 F (36.7 C) (Oral)   Resp 16   Ht 5\' 8"  (1.727 m)   Wt 251 lb 4 oz (114 kg)   SpO2 99%   BMI 38.20 kg/m  General: Well developed, NAD, BMI noted Neck: No  thyromegaly  HEENT:  Normocephalic . Face symmetric, atraumatic Lungs:  CTA B Normal respiratory  effort, no intercostal retractions, no accessory muscle use. Heart: RRR,  no murmur.  Abdomen:  Not distended, soft, non-tender. No rebound or rigidity.   Lower extremities: no pretibial edema bilaterally  Skin: Exposed areas without rash. Not pale. Not jaundice Neurologic:  alert & oriented X3.  Speech normal, gait appropriate for age and unassisted Strength symmetric and appropriate for age.  Psych: Cognition and judgment appear intact.  Cooperative with normal attention span and concentration.  Behavior appropriate. No anxious or depressed appearing.     Assessment     Assessment GERD HPV History of abnormal chest x-ray History of migraines Obesity, BMI 38 IUD FH hypertrophic cardiomyopathy: Echo -2019, recheck echo every 3 to 5 years  Plan: Here for CPX Radiculopathy: Last year was seen with right-sided neck pain with some radiation to the arm, has seen Ortho, chiropractor and  PT.  Pain is decreased but not completely gone, same with radiation.  Encouraged to see Ortho again, they perhaps will consider MRI.  The patient states she will call. Obesity, we had a long discussion about diet, exercise.  Weight watchers or other program to get a more structured diet?12-11-1978 RTC 1 year   This  visit occurred during the SARS-CoV-2 public health emergency.  Safety protocols were in place, including screening questions prior to the visit, additional usage of staff PPE, and extensive cleaning of exam room while observing appropriate contact time as indicated for disinfecting solutions.  

## 2020-11-17 LAB — COMPREHENSIVE METABOLIC PANEL
AG Ratio: 2.1 (calc) (ref 1.0–2.5)
ALT: 14 U/L (ref 6–29)
AST: 11 U/L (ref 10–30)
Albumin: 4.5 g/dL (ref 3.6–5.1)
Alkaline phosphatase (APISO): 35 U/L (ref 31–125)
BUN: 11 mg/dL (ref 7–25)
CO2: 26 mmol/L (ref 20–32)
Calcium: 9.9 mg/dL (ref 8.6–10.2)
Chloride: 103 mmol/L (ref 98–110)
Creat: 0.82 mg/dL (ref 0.50–1.10)
Globulin: 2.1 g/dL (calc) (ref 1.9–3.7)
Glucose, Bld: 84 mg/dL (ref 65–99)
Potassium: 4.2 mmol/L (ref 3.5–5.3)
Sodium: 139 mmol/L (ref 135–146)
Total Bilirubin: 0.2 mg/dL (ref 0.2–1.2)
Total Protein: 6.6 g/dL (ref 6.1–8.1)

## 2020-11-17 LAB — CBC WITH DIFFERENTIAL/PLATELET
Absolute Monocytes: 698 cells/uL (ref 200–950)
Basophils Absolute: 56 cells/uL (ref 0–200)
Basophils Relative: 0.6 %
Eosinophils Absolute: 102 cells/uL (ref 15–500)
Eosinophils Relative: 1.1 %
HCT: 39.7 % (ref 35.0–45.0)
Hemoglobin: 13.2 g/dL (ref 11.7–15.5)
Lymphs Abs: 2976 cells/uL (ref 850–3900)
MCH: 28.8 pg (ref 27.0–33.0)
MCHC: 33.2 g/dL (ref 32.0–36.0)
MCV: 86.5 fL (ref 80.0–100.0)
MPV: 10.2 fL (ref 7.5–12.5)
Monocytes Relative: 7.5 %
Neutro Abs: 5468 cells/uL (ref 1500–7800)
Neutrophils Relative %: 58.8 %
Platelets: 328 10*3/uL (ref 140–400)
RBC: 4.59 10*6/uL (ref 3.80–5.10)
RDW: 12.6 % (ref 11.0–15.0)
Total Lymphocyte: 32 %
WBC: 9.3 10*3/uL (ref 3.8–10.8)

## 2020-11-17 LAB — LIPID PANEL
Cholesterol: 168 mg/dL (ref <200)
HDL: 57 mg/dL (ref >=50)
LDL Cholesterol (Calc): 87 mg/dL (calc)
Non-HDL Cholesterol (Calc): 111 mg/dL (calc) (ref <130)
Total CHOL/HDL Ratio: 2.9 (calc) (ref <5.0)
Triglycerides: 140 mg/dL (ref <150)

## 2020-11-17 LAB — HEPATITIS C ANTIBODY
Hepatitis C Ab: NONREACTIVE
SIGNAL TO CUT-OFF: 0.01 (ref <1.00)

## 2020-11-17 LAB — TSH: TSH: 1.66 mIU/L

## 2020-11-18 ENCOUNTER — Encounter: Payer: Self-pay | Admitting: Internal Medicine

## 2020-11-18 NOTE — Assessment & Plan Note (Signed)
Td 2012  COVID shots x2, plans to get a booster Had a flu shot CCS- never had a cscope  Female care, per gyn , PAP 07-2018 (K PN) Labs :  CMP, FLP, CBC, TSH, hep C Patient location: Diet and exercise

## 2020-11-18 NOTE — Assessment & Plan Note (Signed)
Here for CPX Radiculopathy: Last year was seen with right-sided neck pain with some radiation to the arm, has seen Ortho, chiropractor and  PT.  Pain is decreased but not completely gone, same with radiation.  Encouraged to see Ortho again, they perhaps will consider MRI.  The patient states she will call. Obesity, we had a long discussion about diet, exercise.  Weight watchers or other program to get a more structured diet?Marland Kitchen RTC 1 year

## 2021-01-16 ENCOUNTER — Ambulatory Visit: Payer: No Typology Code available for payment source | Attending: Internal Medicine

## 2021-01-16 ENCOUNTER — Other Ambulatory Visit: Payer: Self-pay | Admitting: Internal Medicine

## 2021-01-16 DIAGNOSIS — Z23 Encounter for immunization: Secondary | ICD-10-CM

## 2021-01-16 NOTE — Progress Notes (Signed)
   Covid-19 Vaccination Clinic  Name:  Anna Cabrera    MRN: [id number] DOB: May 18, 1981  01/16/2021  [name] was observed post Covid-19 immunization for 15 minutes without incident. She was provided with Vaccine Information Sheet and instruction to access the V-Safe system.   [name] was instructed to call 911 with any severe reactions post vaccine: Marland Kitchen Difficulty breathing  . Swelling of face and throat  . A fast heartbeat  . A bad rash all over body  . Dizziness and weakness   Immunizations Administered    Name Date Dose VIS Date Route   Pfizer COVID-19 Vaccine 01/16/2021  9:07 AM 0.3 mL 10/11/2020 Intramuscular   Manufacturer: [employer], [employer]   Lot: [id number]   NDC: [address]-6

## 2021-03-15 ENCOUNTER — Encounter: Payer: Self-pay | Admitting: Internal Medicine

## 2021-04-03 ENCOUNTER — Encounter: Payer: Self-pay | Admitting: Internal Medicine

## 2021-04-25 ENCOUNTER — Other Ambulatory Visit: Payer: No Typology Code available for payment source

## 2021-09-17 ENCOUNTER — Telehealth: Payer: Self-pay | Admitting: Internal Medicine

## 2021-09-17 NOTE — Telephone Encounter (Signed)
Pt. Stated she fell on her bottom/back and there is now a massive bubble on her backend. Transferred to triage to get further instructions on how to proceed due to pain,area and bubble

## 2021-09-17 NOTE — Telephone Encounter (Signed)
urse Assessment Nurse: Yetta Barre, RN, Miranda Date/Time (Eastern Time): 09/17/2021 10:53:52 AM Confirm and document reason for call. If symptomatic, describe symptoms. ---Caller states yesterday she slipped on some stairs and landed hard on her tailbone. She slid down 4-5 stairs on her tailbone. She has pain that is worse on the left than the right. She has swelling and bruising on her left buttock. Standing and walking increases the pain. Does the patient have any new or worsening symptoms? ---Yes Will a triage be completed? ---Yes Related visit to physician within the last 2 weeks? ---No Does the PT have any chronic conditions? (i.e. diabetes, asthma, this includes High risk factors for pregnancy, etc.) ---No Is the patient pregnant or possibly pregnant? (Ask all females between the ages of 44-55) ---No Is this a behavioral health or substance abuse call? ---No Guidelines Guideline Title Affirmed Question Affirmed Notes Nurse Date/Time Lamount Cohen Time) Tailbone Injury Minor tailbone injury Yetta Barre, RN, Miranda 09/17/2021 10:57:31 AM Disp. Time Lamount Cohen Time) Disposition Final User PLEASE NOTE: All timestamps contained within this report are represented as [place] Standard Time. CONFIDENTIALTY NOTICE: This fax transmission is intended only for the addressee. It contains information that is legally privileged, confidential or otherwise protected from use or disclosure. If you are not the intended recipient, you are strictly prohibited from reviewing, disclosing, copying using or disseminating any of this information or taking any action in reliance on or regarding this information. If you have received this fax in error, please notify us immediately by telephone so that we can arrange for its return to [place]. Phone: [phone], Toll-Free: [phone], Fax: [phone] Page: 2 of 2 Call Id: [id number] 09/17/2021 11:05:35 AM Home Care Yes Yetta Barre, RN, Miranda Caller Disagree/Comply Comply Caller  Understands Yes PreDisposition Call Doctor Care Advice Given Per Guideline HOME CARE: * You should be able to treat this at home. REASSURANCE AND EDUCATION - BRUISED TAILBONE: * A bruised tailbone will usually hurt for 3 to 4 weeks. * Even if it's broken (fractured), it should heal on its own. * The overlying skin may also be bruised. USE A COLD PACK FOR PAIN, SWELLING, OR BRUISING: * Put a cold pack or an ice bag (wrapped in a moist towel) on the area for 20 minutes. * Repeat in 1 hour, then every 4 hours while awake. * Continue this for the first 48 hours (2 days) after an injury. * This will help decrease pain, swelling, and bruising. USE HEAT ON AREA AFTER 48 HOURS: * Caution: Avoid frostbite. * If pain, swelling, or bruising last more than 48 hours (2 days), then use heat on the area. * Use a heat pack, heating pad, or warm wet washcloth. * Do this for 10 minutes three times a day. * This will help increase blood flow and improve healing. * SITZ BATHS - Sitting in a tub of warm water is another manner to apply heat to this area. PAIN MEDICINES: * For pain relief, you can take either acetaminophen, ibuprofen, or naproxen. * They are over-the-counter (OTC) pain drugs. You can buy them at the drugstore. * IBUPROFEN (E.G., MOTRIN, ADVIL): Take 400 mg (two 200 mg pills) by mouth every 6 hours. The most you should take is 6 pills a day (1,200 mg total). SIT ON A CUSHION: * Sit on a large rubber ring or a cushion placed forward on the chair to take pressure off the tailbone. Some people find that a cushion with a section cut out of the back portion ('wedge' cushion) is helpful. *  You can get these from the drugstore or on-line. PREVENTING CONSTIPATION: * Eat a high fiber diet. * Drink adequate liquids. * Don't ignore your body's signals regarding having a BM. STOOL SOFTENER (COLACE) FOR HARD BOWEL MOVEMENTS: * Stool softeners help reduce rectal pain during bowel movements. * Colace (docusate sodium)  is available over-thecounter. You can get it at the drugstore. * The adult dose is 100 mg by mouth each day. CALL BACK IF: * Pain lasts over 4 weeks. * Pain becomes worse * Tingling or numbness occurs in legs CARE ADVICE given per Tailbone Injury (Adult) guideline.

## 2021-09-24 ENCOUNTER — Encounter: Payer: Self-pay | Admitting: Internal Medicine

## 2021-09-25 ENCOUNTER — Ambulatory Visit (INDEPENDENT_AMBULATORY_CARE_PROVIDER_SITE_OTHER): Payer: No Typology Code available for payment source | Admitting: Internal Medicine

## 2021-09-25 ENCOUNTER — Other Ambulatory Visit: Payer: Self-pay

## 2021-09-25 ENCOUNTER — Encounter: Payer: Self-pay | Admitting: Internal Medicine

## 2021-09-25 ENCOUNTER — Ambulatory Visit (HOSPITAL_BASED_OUTPATIENT_CLINIC_OR_DEPARTMENT_OTHER)
Admission: RE | Admit: 2021-09-25 | Discharge: 2021-09-25 | Disposition: A | Discharge status: Home / Self Care | Payer: No Typology Code available for payment source | Source: Ambulatory Visit | Attending: Internal Medicine | Admitting: Internal Medicine

## 2021-09-25 VITALS — BP 126/84 | HR 72 | Temp 98.2°F | Resp 16 | Ht 68.0 in | Wt 259.0 lb

## 2021-09-25 DIAGNOSIS — W19XXXA Unspecified fall, initial encounter: Secondary | ICD-10-CM

## 2021-09-25 DIAGNOSIS — S300XXA Contusion of lower back and pelvis, initial encounter: Secondary | ICD-10-CM | POA: Diagnosis not present

## 2021-09-25 DIAGNOSIS — M549 Dorsalgia, unspecified: Secondary | ICD-10-CM | POA: Insufficient documentation

## 2021-09-25 DIAGNOSIS — M47816 Spondylosis without myelopathy or radiculopathy, lumbar region: Secondary | ICD-10-CM | POA: Insufficient documentation

## 2021-09-25 NOTE — Progress Notes (Signed)
Subjective:    Patient ID: Anna Cabrera, female    DOB: 1981-11-30, 40 y.o.   MRN: [id number]  DOS:  09/25/2021 Type of visit - description: Acute A week ago she had a fall. She was going downstairs at home, lost her footing, went down 5 or 6 stairs. She landed on her buttock, is not sure/does not recall any head injury. At this point denies any headache, has chronic neck pain which is not worse.  Her main concern is swelling and pain at the left buttock. Denies pain at the hips per se The pain at the buttock is worse when she sits down or lies on the left   Review of Systems See above   Past Medical History:  Diagnosis Date   GERD (gastroesophageal reflux disease)    H/O headache    HPV in female     Past Surgical History:  Procedure Laterality Date   BREAST REDUCTION SURGERY  2004   CESAREAN SECTION  2011   CYST REMOVAL HAND Right 1996    Allergies as of 09/25/2021       Reactions   Maxidone [hydrocodone-acetaminophen] Nausea And Vomiting   Ortho Evra [norelgestromin-eth Estradiol]         Medication List        Accurate as of September 25, 2021 11:47 AM. If you have any questions, ask your nurse or doctor.          STOP taking these medications    Pfizer-BioNTech COVID-19 Vacc 30 MCG/0.3ML injection Generic drug: COVID-19 mRNA vaccine Proofreader) Stopped by: Willow Ora, MD       TAKE these medications    ibuprofen 400 MG tablet Commonly known as: ADVIL Take 400 mg by mouth as needed.   levonorgestrel 20 MCG/24HR IUD Commonly known as: MIRENA 1 each by Intrauterine route once.           Objective:   Physical Exam Skin:         Comments: Hematoma noted day left buttock, it is a moderately tender, some ecchymosis noted as well.  No warmness   BP 126/84 (BP Location: Left Arm, Patient Position: Sitting, Cuff Size: Normal)   Pulse 72   Temp 98.2 F (36.8 C) (Oral)   Resp 16   Ht 5\' 8"  (1.727 m)   Wt 259 lb (117.5 kg)   SpO2 99%    BMI 39.38 kg/m  General:   Well developed, NAD, BMI noted. HEENT:  Normocephalic . Face symmetric, atraumatic Neck: Range of motion normal, no TTP of the cervical spine. MSK: Slightly TTP at the lumbosacral spine Lower extremities: no pretibial edema bilaterally  Skin: Not pale. Not jaundice Neurologic:  alert & oriented X3.  Speech normal, gait and hip rotation normal Psych--  Cognition and judgment appear intact.  Cooperative with normal attention span and concentration.  Behavior appropriate. No anxious or depressed appearing.      Assessment     Assessment GERD HPV History of abnormal chest x-ray History of migraines Obesity, BMI 38 IUD FH hypertrophic cardiomyopathy: Echo -2019, recheck echo every 3 to 5 years   PLAN Fall, initial encounter, hematoma L buttock. Symptoms as described above, on clinical grounds she has a hematoma, for completeness we will get a x-ray of the lumbosacral spine. Otherwise will recommend Tylenol, warm compresses and observation, anticipate  will take at least 3 weeks to get better.  This visit occurred during the SARS-CoV-2 public health emergency.  Safety protocols were in place,  including screening questions prior to the visit, additional usage of staff PPE, and extensive cleaning of exam room while observing appropriate contact time as indicated for disinfecting solutions.

## 2021-09-25 NOTE — Patient Instructions (Signed)
Tylenol  500 mg OTC 2 tabs a day every 8 hours as needed for pain  Warm compresses twice daily  Call if not gradually improving in the next 2 to 3 weeks  Get x-ray at the first floor.

## 2021-09-26 NOTE — Assessment & Plan Note (Signed)
Fall, initial encounter, hematoma L buttock. Symptoms as described above, on clinical grounds she has a hematoma, for completeness we will get a x-ray of the lumbosacral spine. Otherwise will recommend Tylenol, warm compresses and observation, anticipate  will take at least 3 weeks to get better.

## 2021-10-02 LAB — HM MAMMOGRAPHY

## 2021-10-02 LAB — HM PAP SMEAR: HM Pap smear: DETECTED

## 2021-10-15 ENCOUNTER — Other Ambulatory Visit: Payer: Self-pay

## 2021-10-15 MED ORDER — METRONIDAZOLE 0.75 % VA GEL
VAGINAL | 0 refills | Status: DC
  Filled 2021-10-15: qty 70, 30d supply, fill #0

## 2021-11-06 LAB — HM PAP SMEAR

## 2022-01-27 ENCOUNTER — Encounter: Payer: Self-pay | Admitting: Internal Medicine

## 2022-01-27 DIAGNOSIS — W19XXXD Unspecified fall, subsequent encounter: Secondary | ICD-10-CM

## 2022-01-27 DIAGNOSIS — S300XXA Contusion of lower back and pelvis, initial encounter: Secondary | ICD-10-CM

## 2022-01-30 ENCOUNTER — Ambulatory Visit (INDEPENDENT_AMBULATORY_CARE_PROVIDER_SITE_OTHER): Payer: No Typology Code available for payment source | Admitting: Family Medicine

## 2022-01-30 ENCOUNTER — Ambulatory Visit: Payer: Self-pay

## 2022-01-30 ENCOUNTER — Other Ambulatory Visit: Payer: Self-pay

## 2022-01-30 VITALS — BP 132/88 | HR 95 | Ht 68.0 in | Wt 258.6 lb

## 2022-01-30 DIAGNOSIS — T792XXA Traumatic secondary and recurrent hemorrhage and seroma, initial encounter: Secondary | ICD-10-CM

## 2022-01-30 DIAGNOSIS — M7918 Myalgia, other site: Secondary | ICD-10-CM | POA: Diagnosis not present

## 2022-01-30 NOTE — Progress Notes (Signed)
I, Anna Cabrera, LAT, ATC acting as a scribe for Anna Leader, MD.  Subjective:    CC: Seroma on L buttocks  HPI: Pt is a 41 y/o female c/o a seroma on L buttocks. MOI: On 9/26, pt lost her footing and fell down 5-6 stairs landing on her buttocks. Pt was seen initially by her PCP for this on 10/4 and then sent a MyChart message on 2/5 c/o continued pain. Pt locates pain to the L side of her buttock. Pt feels like the area is swollen.  Radiates: no Numbness/tingling: no- but burning sensation Aggravates: sitting, L-side lying, driving  Treatments tried: heat, Tylenol, warm baths  Dx imaging: 09/25/21 Sacrum/coccyx & L-spine XR  Pertinent review of Systems: no fever or chills  Relevant historical information: migraine history   Objective:    Vitals:   01/30/22 1303  BP: 132/88  Pulse: 95  SpO2: 98%   General: Well Developed, well nourished, and in no acute distress.   MSK: Left buttock: Normal appearing.  Palpable mass deep to superior portion of left buttock. Soft and mobile.  Non-tender.  Normal hip motion.   Lab and Radiology Results  Procedure: Real-time Ultrasound Guided aspiration and Injection of left buttock seroma  Device: Philips Affiniti 50G Images permanently stored and available for review in PACS Ultrasound evaluation prior to injection showed a deep hypoechoic structure mobile measuring greater than 1x5 cm Verbal informed consent obtained.  Discussed risks and benefits of procedure. Warned about infection bleeding damage to structures skin hypopigmentation and fat atrophy among others. Patient expresses understanding and agreement Time-out conducted.   Noted no overlying erythema, induration, or other signs of local infection.   Skin prepped in a sterile fashion.   Local anesthesia: Topical Ethyl chloride.   With sterile technique and under real time ultrasound guidance:  61ml of lidocaine injected into subcutaneous tissue and along planned  aspiration tract.  The skin was then sterilized with isopropyl alcohol.  18 g spinal needle was used to access the large deep seroma. 90 ml of clear to cloudy yellow fluid aspirated.  The seroma was seen to be decompressed.  Syringe was then exchanged.  40mg  of kenalog and 73ml of marcaine was injected.  Completed without difficulty   Pain immediately resolved suggesting accurate placement of the medication.   Advised to call if fevers/chills, erythema, induration, drainage, or persistent bleeding.   Images permanently stored and available for review in the ultrasound unit.  Impression: Technically successful ultrasound guided injection.    Impression and Recommendations:    Assessment and Plan: 41 y.o. female with seroma in the left buttock. She initially had a significant contusion and hematoma of the left buttock about 4 months ago.  She has not improved significantly with time and conservative management.  Thr seroma was aspirated today and injected.  Plan for compression as well if able with compression shorts.  Recheck as needed. Marland Kitchen  PDMP not reviewed this encounter. Orders Placed This Encounter  Procedures   [place] LIMITED JOINT SPACE STRUCTURES LOW LEFT(NO LINKED CHARGES)    Standing Status:   Future    Number of Occurrences:   1    Standing Expiration Date:   07/30/2022    Order Specific Question:   Reason for Exam (SYMPTOM  OR DIAGNOSIS REQUIRED)    Answer:   left buttock pain    Order Specific Question:   Preferred imaging location?    Answer:   [facility]   No orders  of the defined types were placed in this encounter.   Discussed warning signs or symptoms. Please see discharge instructions. Patient expresses understanding.   The above documentation has been reviewed and is accurate and complete Anna Cabrera, M.D.

## 2022-01-30 NOTE — Patient Instructions (Addendum)
Thank you for coming in today.   You received a steroid injection after we aspirated the fluid from your buttock today. Seek immediate medical attention if the joint becomes red, extremely painful, or is oozing fluid.   Recommend compression shorts  Recheck back as need

## 2022-02-12 ENCOUNTER — Ambulatory Visit (INDEPENDENT_AMBULATORY_CARE_PROVIDER_SITE_OTHER): Payer: No Typology Code available for payment source | Admitting: Internal Medicine

## 2022-02-12 ENCOUNTER — Other Ambulatory Visit: Payer: Self-pay

## 2022-02-12 ENCOUNTER — Encounter: Payer: Self-pay | Admitting: Internal Medicine

## 2022-02-12 VITALS — BP 126/82 | HR 82 | Temp 98.1°F | Resp 18 | Ht 68.0 in | Wt 254.0 lb

## 2022-02-12 DIAGNOSIS — B029 Zoster without complications: Secondary | ICD-10-CM | POA: Diagnosis not present

## 2022-02-12 MED ORDER — VALACYCLOVIR HCL 1 G PO TABS
1000.0000 mg | ORAL_TABLET | Freq: Three times a day (TID) | ORAL | 0 refills | Status: DC
  Filled 2022-02-12: qty 21, 7d supply, fill #0

## 2022-02-12 NOTE — Progress Notes (Signed)
° °  Subjective:    Patient ID: Anna Cabrera, female    DOB: 1981-02-26, 41 y.o.   MRN: [id number]  DOS:  02/12/2022 Type of visit - description: acute  9 days ago developed severe pain at the L flank associated with some nausea. It got eventually better with OTCs and a  heating pad. 2 days ago developed a rash. The pain is now extending to the side of the flank and even to the upper abdomen where she feels she is "swollen".   Review of Systems Denies fever chills No nausea or vomiting No dysuria or gross hematuria.  Past Medical History:  Diagnosis Date   GERD (gastroesophageal reflux disease)    H/O headache    HPV in female     Past Surgical History:  Procedure Laterality Date   BREAST REDUCTION SURGERY  2004   CESAREAN SECTION  2011   CYST REMOVAL HAND Right 1996    Current Outpatient Medications  Medication Instructions   ibuprofen (ADVIL) 400 mg, As needed   levonorgestrel (MIRENA) 20 MCG/24HR IUD 1 each,  Once   metroNIDAZOLE (METROGEL) 0.75 % vaginal gel Insert 1 applicatorful every day by vaginal route.       Objective:   Physical Exam BP 126/82 (BP Location: Left Arm, Patient Position: Sitting, Cuff Size: Normal)    Pulse 82    Temp 98.1 F (36.7 C) (Oral)    Resp 18    Ht 5\' 8"  (1.727 m)    Wt 254 lb (115.2 kg)    SpO2 99%    BMI 38.62 kg/m  General:   Well developed, NAD, BMI noted.  HEENT:  Normocephalic . Face symmetric, atraumatic MSK: No TTP at the lumbar spine Abdomen:  Not distended, soft, non-tender. No rebound or rigidity.  No swelling. Skin: + Rash located at the left flank.  See picture Lower extremities: no pretibial edema bilaterally  Neurologic:  alert & oriented X3.  Speech normal, gait appropriate for age and unassisted Psych--  Cognition and judgment appear intact.  Cooperative with normal attention span and concentration.  Behavior appropriate. No anxious or depressed appearing.      Assessment      Assessment GERD HPV History of abnormal chest x-ray History of migraines Obesity, BMI 38 IUD FH hypertrophic cardiomyopathy: Echo -2019, recheck echo every 3 to 5 years   PLAN Shingles: Symptoms as described above consistent with shingles.  Explained the patient what this condition is, what to expect including the possibility of postherpetic neuralgia which I hope is not going to happen. Plan: Valtrex for 1 week, continue OTCs for pain, if the area started itching more okay to use hydrocortisone over-the-counter, call if not better.  Additional information provided, see AVS Seroma L buttock: Recently drained by sports medicine.  Appreciate the help.    This visit occurred during the SARS-CoV-2 public health emergency.  Safety protocols were in place, including screening questions prior to the visit, additional usage of staff PPE, and extensive cleaning of exam room while observing appropriate contact time as indicated for disinfecting solutions.

## 2022-02-12 NOTE — Patient Instructions (Signed)
Take Valtrex as prescribed for 1 week.  The rash should dry up in the next following days  The pain may last for a little longer, okay to take Tylenol and ibuprofen.  If you are not gradually better let us know  Shingles Shingles, which is also known as herpes zoster, is an infection that causes a painful skin rash and fluid-filled blisters. It is caused by a virus. Shingles only develops in people who: Have had chickenpox. Have been vaccinated against chickenpox. Shingles is rare in this group. What are the causes? Shingles is caused by varicella-zoster virus. This is the same virus that causes chickenpox. After a person is exposed to the virus, it stays in the body in an inactive (dormant) state. Shingles develops if the virus is reactivated. This can happen many years after the first (initial) exposure to the virus. It is not known what causes this virus to be reactivated. What increases the risk? People who have had chickenpox or received the chickenpox vaccine are at risk for shingles. Shingles infection is more common in people who: Are older than 43 years of age. Have a weakened disease-fighting system (immune system), such as people with: HIV (human immunodeficiency virus). AIDS (acquired immunodeficiency syndrome). Cancer. Are taking medicines that weaken the immune system, such as organ transplant medicines. Are experiencing a lot of stress. What are the signs or symptoms? Early symptoms of this condition include itching, tingling, and pain in an area on your skin. Pain may be described as burning, stabbing, or throbbing. A few days or weeks after early symptoms start, a painful red rash appears. The rash is usually on one side of the body and has a band-like or belt-like pattern. The rash eventually turns into fluid-filled blisters that break open, change into scabs, and dry up in about 2-3 weeks. At any time during the infection, you may also develop: A fever. Chills. A  headache. Nausea. How is this diagnosed? This condition is diagnosed with a skin exam. Skin or fluid samples (a culture) may be taken from the blisters before a diagnosis is made. How is this treated? The rash may last for several weeks. There is not a specific cure for this condition. Your health care provider may prescribe medicines to help you manage pain, recover more quickly, and avoid long-term problems. Medicines may include: Antiviral medicines. Anti-inflammatory medicines. Pain medicines. Anti-itching medicines (antihistamines). If the area involved is on your face, you may be referred to a specialist, such as an eye doctor (ophthalmologist) or an ear, nose, and throat (ENT) doctor (otorhinolaryngologist) to help you avoid eye problems, chronic pain, or disability. Follow these instructions at home: Medicines Take over-the-counter and prescription medicines only as told by your health care provider. Apply an anti-itch cream or numbing cream to the affected area as told by your health care provider. Relieving itching and discomfort  Apply cold, wet cloths (cold compresses) to the area of the rash or blisters as told by your health care provider. Cool baths can be soothing. Try adding baking soda or dry oatmeal to the water to reduce itching. Do not bathe in hot water. Use calamine lotion as recommended by your health care provider. This is an over-the-counter lotion that helps to relieve itchiness. Blister and rash care Keep your rash covered with a loose bandage (dressing). Wear loose-fitting clothing to help ease the pain of material rubbing against the rash. Wash your hands with soap and water for at least 20 seconds before and after you  change your dressing. If soap and water are not available, use hand sanitizer. Change your dressing as told by your health care provider. Keep your rash and blisters clean by washing the area with mild soap and cool water as told by your health  care provider. Check your rash every day for signs of infection. Check for: More redness, swelling, or pain. Fluid or blood. Warmth. Pus or a bad smell. Do not scratch your rash or pick at your blisters. To help avoid scratching: Keep your fingernails clean and cut short. Wear gloves or mittens while you sleep, if scratching is a problem. General instructions Rest as told by your health care provider. Wash your hands often with soap and water for at least 20 seconds. If soap and water are not available, use hand sanitizer. Doing this lowers your chance of getting a bacterial skin infection. Before your blisters change into scabs, your shingles infection can cause chickenpox in people who have never had it or have never been vaccinated against it. To prevent this from happening, avoid contact with other people, especially: Babies. Pregnant women. Children who have eczema. Older people who have transplants. People who have chronic illnesses, such as cancer or AIDS. Keep all follow-up visits. This is important. How is this prevented? Getting vaccinated is the best way to prevent shingles and protect against shingles complications. If you have not been vaccinated, talk with your health care provider about getting the vaccine. Where to find more information Centers for Disease Control and Prevention: [employer]Ex[employer].com.br Contact a health care provider if: Your pain is not relieved with prescribed medicines. Your pain does not get better after the rash heals. You have any of these signs of infection: More redness, swelling, or pain around the rash. Fluid or blood coming from the rash. Warmth coming from your rash. Pus or a bad smell coming from the rash. A fever. Get help right away if: The rash is on your face or nose. You have facial pain, pain around your eye area, or loss of feeling on one side of your face. You have difficulty seeing. You have ear pain or have ringing in your ear. You have  a loss of taste. Your condition gets worse. Summary Shingles, also known as herpes zoster, is an infection that causes a painful skin rash and fluid-filled blisters. This condition is diagnosed with a skin exam. Skin or fluid samples (a culture) may be taken from the blisters. Keep your rash covered with a loose bandage (dressing). Wear loose-fitting clothing to help ease the pain of material rubbing against the rash. Before your blisters change into scabs, your shingles infection can cause chickenpox in people who have never had it or have never been vaccinated against it. This information is not intended to replace advice given to you by your health care provider. Make sure you discuss any questions you have with your health care provider. Document Revised: 12/04/2020 Document Reviewed: 12/04/2020 Elsevier Patient Education  2022 [employer].

## 2022-02-13 ENCOUNTER — Encounter: Payer: Self-pay | Admitting: *Deleted

## 2022-02-13 ENCOUNTER — Encounter: Payer: Self-pay | Admitting: Internal Medicine

## 2022-02-14 DIAGNOSIS — B029 Zoster without complications: Secondary | ICD-10-CM | POA: Insufficient documentation

## 2022-02-14 NOTE — Assessment & Plan Note (Signed)
Shingles: Symptoms as described above consistent with shingles.  Explained the patient what this condition is, what to expect including the possibility of postherpetic neuralgia which I hope is not going to happen. Plan: Valtrex for 1 week, continue OTCs for pain, if the area started itching more okay to use hydrocortisone over-the-counter, call if not better.  Additional information provided, see AVS Seroma L buttock: Recently drained by sports medicine.  Appreciate the help.

## 2022-02-27 NOTE — Progress Notes (Signed)
? ?  I, Christoper Fabian, LAT, ATC, am serving as scribe for Dr. Clementeen Graham. ? ?Anna Cabrera is a 41 y.o. female who presents to [facility] today for f/u of L buttocks pain due to a seroma that occurred after falling down some steps on 09/17/21, landing on her buttocks.  She was last seen by Dr. Denyse Amass on 01/30/22 and had an aspiration/injection of her L buttock seroma.  She was also advised to purchase some compression shorts.  Today, pt reports  that her L buttocks is still sensitive and swollen.  She notes that the irritated area is not firm. ? ?Diagnostic testing: L-spine and sacrum/coccyx XR- 09/25/21 ? ?Pertinent review of systems: No fevers or chills ? ?Relevant historical information: Migraine history. ? ? ?Exam:  ?BP 124/78 (BP Location: Right Arm, Patient Position: Sitting, Cuff Size: Large)   Pulse 85   Ht 5\' 8"  (1.727 m)   Wt 258 lb 3.2 oz (117.1 kg)   SpO2 95%   BMI 39.26 kg/m?  ?General: Well Developed, well nourished, and in no acute distress.  ? ?MSK: Left buttocks normal. ?Tender palpation at SI joint.  No palpable nodules or seroma present.  Normal hip motion. ? ? ? ?Lab and Radiology Results ? ?Diagnostic Limited MSK Ultrasound of: Left buttocks ?No visible seroma present on left buttocks MSK ultrasound examination. ?Area of tenderness is left SI joint.  Normal bony structures. ?Impression: No visible seroma.  Tender palpation left SI joint. ? ? ? ? ?Assessment and Plan: ?41 y.o. female with left buttocks pain after a traumatic seroma occurring in September.  This was aspirated and injected 1 month ago.  She still has discomfort now mostly located at the top of her buttocks near her SI joint.  We discussed options.  She is a good candidate for physical therapy trial.  Plan for trial of PT.  She lives in the [place] area but takes her kid to school in [place] so we will try to pick a PT location in [place] [facility] care).  If this cannot work  [facility] in [place] may be a good option.  ?Recheck in 6 weeks.  If not improved would consider trial of SI joint injection followed by MRI if still not better. ? ? ?PDMP not reviewed this encounter. ?Orders Placed This Encounter  ?Procedures  ? [place] LIMITED JOINT SPACE STRUCTURES LOW LEFT(NO LINKED CHARGES)  ?  Order Specific Question:   Reason for Exam (SYMPTOM  OR DIAGNOSIS REQUIRED)  ?  Answer:   seroma  ?  Order Specific Question:   Preferred imaging location?  ?  Answer:   [facility]  ? Ambulatory referral to Physical Therapy  ?  Referral Priority:   Routine  ?  Referral Type:   Physical Medicine  ?  Referral Reason:   Specialty Services Required  ?  Requested Specialty:   Physical Therapy  ?  Number of Visits Requested:   1  ? ?No orders of the defined types were placed in this encounter. ? ? ? ?Discussed warning signs or symptoms. Please see discharge instructions. Patient expresses understanding. ? ? ?The above documentation has been reviewed and is accurate and complete [facility], M.D. ? ? ?

## 2022-02-28 ENCOUNTER — Encounter: Payer: Self-pay | Admitting: Family Medicine

## 2022-02-28 ENCOUNTER — Other Ambulatory Visit: Payer: Self-pay

## 2022-02-28 ENCOUNTER — Ambulatory Visit: Payer: Self-pay

## 2022-02-28 ENCOUNTER — Ambulatory Visit (INDEPENDENT_AMBULATORY_CARE_PROVIDER_SITE_OTHER): Payer: No Typology Code available for payment source | Admitting: Family Medicine

## 2022-02-28 VITALS — BP 124/78 | HR 85 | Ht 68.0 in | Wt 258.2 lb

## 2022-02-28 DIAGNOSIS — M533 Sacrococcygeal disorders, not elsewhere classified: Secondary | ICD-10-CM

## 2022-02-28 DIAGNOSIS — G8929 Other chronic pain: Secondary | ICD-10-CM

## 2022-02-28 DIAGNOSIS — T792XXA Traumatic secondary and recurrent hemorrhage and seroma, initial encounter: Secondary | ICD-10-CM

## 2022-02-28 DIAGNOSIS — M7918 Myalgia, other site: Secondary | ICD-10-CM

## 2022-02-28 NOTE — Patient Instructions (Addendum)
Good to see you today. ? ?I've referred you to Physical Therapy.  Let us know if you don't hear from them in one week.  ? ?Follow-up: 6 weeks  ? ?Let me know if this PT location will not work. I have other options.  ? ? ?

## 2022-03-12 NOTE — Therapy (Signed)
?OUTPATIENT PHYSICAL THERAPY LOWER EXTREMITY EVALUATION ? ? ?Patient Name: Anna Cabrera ?MRN: [id number] ?DOB:08/01/1981, 41 y.o., female ?Today's Date: 03/13/2022 ? ? PT End of Session - 03/13/22 [id number]   ? ? Visit Number 1   ? Number of Visits 7   ? Date for PT Re-Evaluation 05/03/22   ? PT Start Time [id number]   ? PT Stop Time 0930   ? PT Time Calculation (min) 37 min   ? Activity Tolerance Patient tolerated treatment well   ? [facility] tasks assessed/performed   ? ?  ?  ? ?  ? ? ?Past Medical History:  ?Diagnosis Date  ? GERD (gastroesophageal reflux disease)   ? H/O headache   ? HPV in female   ? ?Past Surgical History:  ?Procedure Laterality Date  ? BREAST REDUCTION SURGERY  2004  ? CESAREAN SECTION  2011  ? CYST REMOVAL HAND Right 1996  ? ?Patient Active Problem List  ? Diagnosis Date Noted  ? Herpes zoster without complication 02/14/2022  ? PCP NOTES >>>>>> 11/26/2016  ? Lung nodule 11/26/2016  ? Annual physical exam 11/25/2016  ? Migraine without aura and with status migrainosus, not intractable 04/18/2015  ? Back pain 12/31/2014  ? BMI 37.0-37.9,adult   ? ? ?PCP: Wanda PlumpPaz, Jose E, MD ? ?REFERRING PROVIDER: Rodolph Bongorey, Evan S, MD ? ?REFERRING DIAG: T79.2XXA (ICD-10-CM) - Seroma due to trauma [facility](HCC) ?M79.18 (ICD-10-CM) - Left buttock pain ?M53.3,G89.29 (ICD-10-CM) - Chronic left SI joint pain ? ?THERAPY DIAG:  ?Gluteal pain - Plan: PT plan of care cert/re-cert ? ?Acute left-sided low back pain without sciatica - Plan: PT plan of care cert/re-cert ? ?Muscle weakness (generalized) - Plan: PT plan of care cert/re-cert ? ?Localized edema - Plan: PT plan of care cert/re-cert ? ?ONSET DATE: 09/16/2021 ? ?SUBJECTIVE:  Pt stating fall on her bottom when going down her stairs. Pt stating she bumped down 4-5 steps on her bottom.  ? ? ? ?PERTINENT HISTORY: ?unremarkable ? ?PAIN:  ?Are you having pain? Yes: NPRS scale: 1/10 ?Pain location: left SI joint ?Pain description: dull, discomfort ?Aggravating factors:  jogging,  ?Relieving factors: resting ?Pain can vary daily depending on activity.  ? ?PRECAUTIONS: None ? ?WEIGHT BEARING RESTRICTIONS No ? ?FALLS:  ?Has patient fallen in last 6 months? Yes, Number of falls: 1 ? ?LIVING ENVIRONMENT: ?Lives with: lives with their family ?Lives in: House/apartment ?Stairs: Yes;  1  flight rail on left ?Has following equipment at home: None ? 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 ?PLOF: Independent ? ?PATIENT GOALS: Be able to perform daily activities without pain ? ? ?OBJECTIVE:  ? ?DIAGNOSTIC FINDINGS:  ?Seroma was drained on 02/27/2022 ? ?PATIENT SURVEYS:  ?03/13/2022: FOTO 77% (predicted 79%) ? ?COGNITION: ? Overall cognitive status: Within functional limits for tasks assessed   ? ? ?MUSCLE LENGTH: ?03/13/2022:  ?Hamstrings: Right 75 deg; Left 64 deg ?Thomas test: Right positive  Left positive  ? ?POSTURE:  ?Rounded shoulder, forward head ? ?PALPATION: ?TTP: left SI joint line, left piriformis and gluteals ? ?LE MMT: ? ?MMT Right ?03/13/2022 Left ?03/13/2022  ?Hip flexion 5/5 4+/5  ?Hip extension 5/5 4+/5  ?Hip abduction 5/5 4+/5  ?Hip adduction 5/5 4+/5  ?Hip internal rotation    ?Hip external rotation    ?Knee flexion 5/5 5/5  ?Knee extension 5/5 5/5  ?Ankle dorsiflexion    ?Ankle plantarflexion    ?Ankle inversion    ?Ankle eversion    ? (Blank  rows = not tested) ? ?LE ROM: ? ?Active ROM Right ?03/13/2022 Left ?03/13/2022  ?Hip flexion 115 105  ?Hip extension 30 20  ?Hip abduction 40 35  ?Hip adduction    ?Hip internal rotation    ?Hip external rotation    ? (Blank rows = not tested) ? ?LOWER EXTREMITY SPECIAL TESTS:  ?03/13/2022 : Hip special tests: SI compression test: positive  ? ?FUNCTIONAL TESTS:  ?03/13/2022: 5 times sit to stand: 10 seconds no UE support ? ?GAIT: ?Distance walked: 50 feet  ?Assistive device utilized: None ?Level of assistance: Complete Independence ?Comments: normalized gait pattern ? ? ? ?TODAY'S TREATMENT: ?03/13/2022:  ?HEP instruction/performance c  cues for techniques, handout provided.  Trial set performed of each for comprehension and symptom assessment.  See below for exercise list. ? ? ?PATIENT EDUCATION:  ?Education details: PT, POC, HEP, DN discussed  ?Person educated: Patient ?Education method: Explanation, Demonstration, Tactile cues, Verbal cues, and Handouts ?Education comprehension: verbalized understanding and returned demonstration ? ? ?HOME EXERCISE PROGRAM: ?Access Code: [email or web address] ?URL: [email or web address] ?Date: 03/13/2022 ?Prepared by: Narda Amber ? ?Exercises ?Supine Piriformis Stretch with Foot on Ground - 2 x daily - 7 x weekly - 3-5 reps - 20 seconds hold ?Supine Figure 4 Piriformis Stretch - 2 x daily - 7 x weekly - 3 sets - 3-5 reps - 20 seocnds hold ?Hip Flexor Stretch at Edge of Bed - 1 x daily - 7 x weekly - 3 sets - 3-5 reps - 20 seconds hold ?Supine Bridge - 2 x daily - 7 x weekly - 2 sets - 10 reps - 5 seconds hold ?Supine Lower Trunk Rotation - 2-3 x daily - 7 x weekly - 3-5 reps - 20 seconds hold ? ?ASSESSMENT: ? ?CLINICAL IMPRESSION: ?03/13/2022:  ?Patient is a 41 y.o. female who comes to clinic with complaints of left buttock pain s/p fall down 5 steps at home on her bottom. Pt with seroma which has been drained. Pt with mobility, strength and movement coordination deficits that impair their ability to perform usual daily and recreational functional activities without increase difficulty/symptoms at this time.  Patient to benefit from skilled PT services to address impairments and limitations to improve to previous level of function without restriction secondary to condition.  ? ? ?OBJECTIVE IMPAIRMENTS decreased balance, decreased mobility, difficulty walking, decreased ROM, decreased strength, increased edema, and pain.  ? ?ACTIVITY LIMITATIONS community activity and occupation.  ? ?PERSONAL FACTORS fitness are also affecting patient's functional outcome.  ? ? ?REHAB POTENTIAL: Good ? ?CLINICAL DECISION  MAKING: Stable/uncomplicated ? ?EVALUATION COMPLEXITY: Low ? ? ?GOALS: ?Goals reviewed with patient? Yes ?Short term PT Goals (target date for Short term goals are 3 weeks 04/03/2022) ?Patient will demonstrate independent use of home exercise program to maintain progress from in clinic treatments. ?Goal status: New ?  ?Long term PT goals (target dates for all long term goals are 6 weeks  05/03/2022 ) ?Patient will demonstrate/report pain at worst less than or equal to 2/10 to facilitate minimal limitation in daily activity secondary to pain symptoms. ?Goal status: New ? ?Patient will demonstrate independent use of home exercise program to facilitate ability to maintain/progress functional gains from skilled physical therapy services. ?Goal status: New ? ?Patient will demonstrate FOTO outcome > or = 79% % to indicate reduced disability due to condition. ?Goal status: New ? ?Pt will be able to fast walk for 5 minutes with SI pain </= 2/10.  ?Goal status: New ? ?    5.  Pt will be able to perform full squat with no pain noted.  ?Goal status: New ? ? ?PLAN: ?PT FREQUENCY: 1x/week ? ?PT DURATION: 6 weeks ? ?PLANNED INTERVENTIONS:  ?Therapeutic exercises, Therapeutic activity, Neuro Muscular re-education, Balance training, Gait training, Patient/Family education, Joint mobilization, Stair training, DME instructions, Dry Needling, Electrical stimulation, Cryotherapy, Moist heat, Taping, Ultrasound, Ionotophoresis 4mg /ml Dexamethasone, and Manual therapy.  All included unless contraindicated ? ?PLAN FOR NEXT SESSION: Nustep, Leg Press, muscle energy techniques, pelvic mobs, core strengthening ? ? ? , PT, MPT ?03/13/2022, 12:16 PM ? ?

## 2022-03-13 ENCOUNTER — Other Ambulatory Visit: Payer: Self-pay

## 2022-03-13 ENCOUNTER — Encounter: Payer: Self-pay | Admitting: Physical Therapy

## 2022-03-13 ENCOUNTER — Ambulatory Visit: Payer: No Typology Code available for payment source | Admitting: Physical Therapy

## 2022-03-13 DIAGNOSIS — R600 Localized edema: Secondary | ICD-10-CM

## 2022-03-13 DIAGNOSIS — M7918 Myalgia, other site: Secondary | ICD-10-CM

## 2022-03-13 DIAGNOSIS — M6281 Muscle weakness (generalized): Secondary | ICD-10-CM | POA: Diagnosis not present

## 2022-03-13 DIAGNOSIS — M545 Low back pain, unspecified: Secondary | ICD-10-CM | POA: Diagnosis not present

## 2022-03-28 ENCOUNTER — Encounter: Payer: Self-pay | Admitting: Internal Medicine

## 2022-04-01 ENCOUNTER — Encounter: Payer: Self-pay | Admitting: Physical Therapy

## 2022-04-01 ENCOUNTER — Ambulatory Visit: Payer: No Typology Code available for payment source | Admitting: Physical Therapy

## 2022-04-01 DIAGNOSIS — M545 Low back pain, unspecified: Secondary | ICD-10-CM

## 2022-04-01 DIAGNOSIS — M6281 Muscle weakness (generalized): Secondary | ICD-10-CM | POA: Diagnosis not present

## 2022-04-01 DIAGNOSIS — R600 Localized edema: Secondary | ICD-10-CM

## 2022-04-01 DIAGNOSIS — M7918 Myalgia, other site: Secondary | ICD-10-CM

## 2022-04-01 NOTE — Therapy (Signed)
?OUTPATIENT PHYSICAL THERAPY TREATMENT NOTE ? ? ?Patient Name: Anna Cabrera ?MRN: [id number] ?DOB:1981-09-30, 41 y.o., female ?Today's Date: 04/01/2022 ? ?PCP: Wanda Plump, MD ?REFERRING PROVIDER: Rodolph Bong, MD ? ?END OF SESSION:  ? PT End of Session - 04/01/22 2706   ? ? Visit Number 2   ? Number of Visits 7   ? Date for PT Re-Evaluation 05/03/22   ? PT Start Time 0845   ? PT Stop Time 0930   ? PT Time Calculation (min) 45 min   ? Activity Tolerance Patient tolerated treatment well   ? Behavior During Therapy [facility] for tasks assessed/performed   ? ?  ?  ? ?  ? ? ?Past Medical History:  ?Diagnosis Date  ? GERD (gastroesophageal reflux disease)   ? H/O headache   ? HPV in female   ? ?Past Surgical History:  ?Procedure Laterality Date  ? BREAST REDUCTION SURGERY  2004  ? CESAREAN SECTION  2011  ? CYST REMOVAL HAND Right 1996  ? ?Patient Active Problem List  ? Diagnosis Date Noted  ? Herpes zoster without complication 02/14/2022  ? PCP NOTES >>>>>> 11/26/2016  ? Lung nodule 11/26/2016  ? Annual physical exam 11/25/2016  ? Migraine without aura and with status migrainosus, not intractable 04/18/2015  ? Back pain 12/31/2014  ? BMI 37.0-37.9,adult   ? ? ?THERAPY DIAG:  ?Gluteal pain ? ?Acute left-sided low back pain without sciatica ? ?Muscle weakness (generalized) ? ?Localized edema ? ?PCP: Wanda Plump, MD ?  ?REFERRING PROVIDER: Rodolph Bong, MD ?  ?REFERRING DIAG: T79.2XXA (ICD-10-CM) - Seroma due to trauma [facility]) ?M79.18 (ICD-10-CM) - Left buttock pain ?M53.3,G89.29 (ICD-10-CM) - Chronic left SI joint pain ?  ?ONSET DATE: 09/16/2021 ?  ?SUBJECTIVE:  She says pain is not too bad today, it comes and goes, she has tried the HEP but has not been as consistent as recommended, she does feel the stretching helps some. ?  ?PAIN:  ?Are you having pain? Yes: NPRS scale: 4/10 ?Pain location: left SI joint ?Pain description: dull, discomfort ?Aggravating factors: jogging,  ?Relieving factors: resting ?Pain can vary daily  depending on activity.  ?  ?PRECAUTIONS: None ?  ?WEIGHT BEARING RESTRICTIONS No ?  ?FALLS:  ?Has patient fallen in last 6 months? Yes, Number of falls: 1 ? 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  ?PLOF: Independent ?  ?PATIENT GOALS: Be able to perform daily activities without pain ?  ?  ?OBJECTIVE:  ?  ?DIAGNOSTIC FINDINGS:  ?Seroma was drained on 02/27/2022 ?  ?PATIENT SURVEYS:  ?03/13/2022: FOTO 77% (predicted 79%)             ?  ?MUSCLE LENGTH: ?03/13/2022:  ?Hamstrings: Right 75 deg; Left 64 deg ?Thomas test: Right positive  Left positive  ?  ?POSTURE:  ?Rounded shoulder, forward head ?  ?PALPATION: ?TTP: left SI joint line, left piriformis and gluteals ?  ?LE MMT: ?  ?MMT Right ?03/13/2022 Left ?03/13/2022  ?Hip flexion 5/5 4+/5  ?Hip extension 5/5 4+/5  ?Hip abduction 5/5 4+/5  ?Hip adduction 5/5 4+/5  ?Hip internal rotation      ?Hip external rotation      ?Knee flexion 5/5 5/5  ?Knee extension 5/5 5/5  ?Ankle dorsiflexion      ?Ankle plantarflexion      ?Ankle inversion      ?Ankle eversion      ? (Blank rows = not tested) ?  ?LE ROM: ?  ?  Active ROM Right ?03/13/2022 Left ?03/13/2022  ?Hip flexion 115 105  ?Hip extension 30 20  ?Hip abduction 40 35  ?Hip adduction      ?Hip internal rotation      ?Hip external rotation      ? (Blank rows = not tested) ?  ?LOWER EXTREMITY SPECIAL TESTS:  ?03/13/2022 : Hip special tests: SI compression test: positive  ?  ?FUNCTIONAL TESTS:  ?03/13/2022: 5 times sit to stand: 10 seconds no UE support ?  ?GAIT: ?Distance walked: 50 feet  ?Assistive device utilized: None ?Level of assistance: Complete Independence ?Comments: normalized gait pattern ?  ?  ?  ?TODAY'S TREATMENT: ?04/01/22 ?Therapeutic Exercise: ?Aerobic:  ?recumbent bike L3 X 6 min ?Supine: ?piriformis stretch pushing knee down 3X30 seconds bilat and pushing knee to opposite shoulder 3X30 sec bilat.  ?Bilat hip flexor/quad stretch supine with leg off EOB 3X30 sec bilat.  ?Left hamstring stretch 3X30 sec with  strap ?Bridges 2X10 holding 5 sec ?Hip adduction isometric ball squeeeze 5 sec X15 ?Hip abduction isometric 5 sec X15 with strap tied around knees ?Bilat hip muscle energy technique Lt hip flexion with Rt hip extension isometrics 5 sec X 10, then switched to Rt hip flexion and left hip extension 5 sec X10 ?Standing:  ?Lt hip abductions X15, extensions X15.  ?Machines: ?Manual Therapy: ?Therapeutic Activity: ?Modalities:  ? ?03/13/2022:  ?HEP instruction/performance c cues for techniques, handout provided.  Trial set performed of each for comprehension and symptom assessment.  See below for exercise list. ?  ?  ?PATIENT EDUCATION:  ?Education details: PT, POC, HEP, DN discussed  ?Person educated: Patient ?Education method: Explanation, Demonstration, Tactile cues, Verbal cues, and Handouts ?Education comprehension: verbalized understanding and returned demonstration ?  ?  ?HOME EXERCISE PROGRAM: ?Access Code: [email or web address] ?URL: [email or web address] ?Date: 03/13/2022 ?Prepared by: Narda AmberJennifer Martin ?  ?Exercises ?Supine Piriformis Stretch with Foot on Ground - 2 x daily - 7 x weekly - 3-5 reps - 20 seconds hold ?Supine Figure 4 Piriformis Stretch - 2 x daily - 7 x weekly - 3 sets - 3-5 reps - 20 seocnds hold ?Hip Flexor Stretch at Edge of Bed - 1 x daily - 7 x weekly - 3 sets - 3-5 reps - 20 seconds hold ?Supine Bridge - 2 x daily - 7 x weekly - 2 sets - 10 reps - 5 seconds hold ?Supine Lower Trunk Rotation - 2-3 x daily - 7 x weekly - 3-5 reps - 20 seconds hold ?  ?ASSESSMENT: ?  ?CLINICAL IMPRESSION: ?04/01/22 ?Session focused on HEP review and she shows good overall understanding of this. I did progress her hip strengthening some today with good overall tolerance noted. PT recommending to continue with current plan of care.  ? ?03/13/2022:  ?Patient is a 41 y.o. female who comes to clinic with complaints of left buttock pain s/p fall down 5 steps at home on her bottom. Pt with seroma which has been drained.  Pt with mobility, strength and movement coordination deficits that impair their ability to perform usual daily and recreational functional activities without increase difficulty/symptoms at this time.  Patient to benefit from skilled PT services to address impairments and limitations to improve to previous level of function without restriction secondary to condition.  ?  ?  ?OBJECTIVE IMPAIRMENTS decreased balance, decreased mobility, difficulty walking, decreased ROM, decreased strength, increased edema, and pain.  ?  ?ACTIVITY LIMITATIONS community activity and occupation.  ?  ?PERSONAL FACTORS fitness are also affecting patient's functional  outcome.  ?  ?  ?REHAB POTENTIAL: Good ?  ?CLINICAL DECISION MAKING: Stable/uncomplicated ?  ?EVALUATION COMPLEXITY: Low ?  ?  ?GOALS: ?Goals reviewed with patient? Yes ?Short term PT Goals (target date for Short term goals are 3 weeks 04/03/2022) ?Patient will demonstrate independent use of home exercise program to maintain progress from in clinic treatments. ?Goal status: reviewed for understanding and technique on 04/01/22 ?  ?Long term PT goals (target dates for all long term goals are 6 weeks  05/03/2022 ) ?Patient will demonstrate/report pain at worst less than or equal to 2/10 to facilitate minimal limitation in daily activity secondary to pain symptoms. ?Goal status: New ?  ?Patient will demonstrate independent use of home exercise program to facilitate ability to maintain/progress functional gains from skilled physical therapy services. ?Goal status: New ?  ?Patient will demonstrate FOTO outcome > or = 79% % to indicate reduced disability due to condition. ?Goal status: New ?  ?Pt will be able to fast walk for 5 minutes with SI pain </= 2/10.  ?Goal status: New ?  ?    5.  Pt will be able to perform full squat with no pain noted.  ?Goal status: New ?  ?  ?PLAN: ?PT FREQUENCY: 1x/week ?  ?PT DURATION: 6 weeks ?  ?PLANNED INTERVENTIONS:  ?Therapeutic exercises, Therapeutic  activity, Neuro Muscular re-education, Balance training, Gait training, Patient/Family education, Joint mobilization, Stair training, DME instructions, Dry Needling, Electrical stimulation, Cryotherapy, Mois

## 2022-04-11 ENCOUNTER — Encounter: Payer: Self-pay | Admitting: Physical Therapy

## 2022-04-11 ENCOUNTER — Ambulatory Visit: Payer: No Typology Code available for payment source | Admitting: Physical Therapy

## 2022-04-11 ENCOUNTER — Ambulatory Visit: Payer: No Typology Code available for payment source | Admitting: Family Medicine

## 2022-04-11 DIAGNOSIS — M7918 Myalgia, other site: Secondary | ICD-10-CM | POA: Diagnosis not present

## 2022-04-11 DIAGNOSIS — M545 Low back pain, unspecified: Secondary | ICD-10-CM

## 2022-04-11 DIAGNOSIS — R600 Localized edema: Secondary | ICD-10-CM | POA: Diagnosis not present

## 2022-04-11 DIAGNOSIS — M6281 Muscle weakness (generalized): Secondary | ICD-10-CM

## 2022-04-11 NOTE — Therapy (Addendum)
OUTPATIENT PHYSICAL THERAPY TREATMENT NOTE Discharge Summary   Patient Name: Anna Cabrera MRN: [id number] DOB:1981/10/05, 41 y.o., female Today's Date: 04/11/2022  PCP: Colon Branch, MD REFERRING PROVIDER: Gregor Hams, MD  END OF SESSION:   PT End of Session - 04/11/22 1522     Visit Number 3    Number of Visits 7    Date for PT Re-Evaluation 05/03/22    PT Start Time 1512    PT Stop Time 0623   she needed to leave early   PT Time Calculation (min) 38 min    Activity Tolerance Patient tolerated treatment well    Behavior During Therapy [facility] for tasks assessed/performed             Past Medical History:  Diagnosis Date   GERD (gastroesophageal reflux disease)    H/O headache    HPV in female    Past Surgical History:  Procedure Laterality Date   BREAST REDUCTION SURGERY  2004   CESAREAN SECTION  2011   CYST REMOVAL HAND Right 1996   Patient Active Problem List   Diagnosis Date Noted   Herpes zoster without complication 76/28/3151   PCP NOTES >>>>>> 11/26/2016   Lung nodule 11/26/2016   Annual physical exam 11/25/2016   Migraine without aura and with status migrainosus, not intractable 04/18/2015   Back pain 12/31/2014   BMI 37.0-37.9,adult     THERAPY DIAG:  Gluteal pain  Acute left-sided low back pain without sciatica  Muscle weakness (generalized)  Localized edema  PCP: Colon Branch, MD   REFERRING PROVIDER: Gregor Hams, MD   REFERRING DIAG: T79.2XXA (ICD-10-CM) - Seroma due to trauma ([place]) M79.18 (ICD-10-CM) - Left buttock pain M53.3,G89.29 (ICD-10-CM) - Chronic left SI joint pain   ONSET DATE: 09/16/2021   SUBJECTIVE:  She relays the pain just in one spot really which is over the left SIJ, bothers her when she lays on that side   PAIN:  Are you having pain? Yes: NPRS scale: 4/10 Pain location: left SI joint Pain description: dull, discomfort Aggravating factors: jogging,  Relieving factors: resting Pain can vary daily depending on  activity.    PRECAUTIONS: None   WEIGHT BEARING RESTRICTIONS No   FALLS:  Has patient fallen in last 6 months? 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    PLOF: Independent   PATIENT GOALS: Be able to perform daily activities without pain     OBJECTIVE:    DIAGNOSTIC FINDINGS:  Seroma was drained on 02/27/2022   PATIENT SURVEYS:  03/13/2022: FOTO 77% (predicted 79%)               MUSCLE LENGTH: 03/13/2022:  Hamstrings: Right 75 deg; Left 64 deg Thomas test: Right positive  Left positive    POSTURE:  Rounded shoulder, forward head   PALPATION: TTP: left SI joint line, left piriformis and gluteals   LE MMT:   MMT Right 03/13/2022 Left 03/13/2022  Hip flexion 5/5 4+/5  Hip extension 5/5 4+/5  Hip abduction 5/5 4+/5  Hip adduction 5/5 4+/5  Hip internal rotation      Hip external rotation      Knee flexion 5/5 5/5  Knee extension 5/5 5/5  Ankle dorsiflexion      Ankle plantarflexion      Ankle inversion      Ankle eversion       (Blank rows = not tested)   LE ROM:  Active ROM Right 03/13/2022 Left 03/13/2022  Hip flexion 115 105  Hip extension 30 20  Hip abduction 40 35  Hip adduction      Hip internal rotation      Hip external rotation       (Blank rows = not tested)   LOWER EXTREMITY SPECIAL TESTS:  03/13/2022 : Hip special tests: SI compression test: positive    FUNCTIONAL TESTS:  03/13/2022: 5 times sit to stand: 10 seconds no UE support   GAIT: Distance walked: 50 feet  Assistive device utilized: None Level of assistance: Complete Independence Comments: normalized gait pattern       TODAY'S TREATMENT: 04/11/22 Therapeutic Exercise: Aerobic:  recumbent bike L5 X 5 min  Supine: piriformis stretch pushing knee down 2X30 seconds bilat and pushing knee to opposite shoulder 2X30 sec bilat.  Bridges 2X10 holding 5 sec Bilat hip muscle energy technique hip flexion with contralateral hip extension isometrics 5 sec  X 10  Standing:  Lateral step ups and overs 6 inch step X 10 bilat Lateral walking and monster walking green band around knees X 3 round trip at counter top  Machines: DL leg press 81# 2X10, SL leg press 50# 2X10 on each side  Percussive device X 5 min to left glutes  04/01/22 Therapeutic Exercise: Aerobic:  recumbent bike L3 X 6 min Supine: piriformis stretch pushing knee down 3X30 seconds bilat and pushing knee to opposite shoulder 3X30 sec bilat.  Bilat hip flexor/quad stretch supine with leg off EOB 3X30 sec bilat.  Left hamstring stretch 3X30 sec with strap Bridges 2X10 holding 5 sec Hip adduction isometric ball squeeeze 5 sec X15 Hip abduction isometric 5 sec X15 with strap tied around knees Bilat hip muscle energy technique Lt hip flexion with Rt hip extension isometrics 5 sec X 10, then switched to Rt hip flexion and left hip extension 5 sec X10 Standing:  Lt hip abductions X15, extensions X15.  Machines:    03/13/2022:  HEP instruction/performance c cues for techniques, handout provided.  Trial set performed of each for comprehension and symptom assessment.  See below for exercise list.     PATIENT EDUCATION:  Education details: PT, POC, HEP, DN discussed  Person educated: Patient Education method: Explanation, Demonstration, Tactile cues, Verbal cues, and Handouts Education comprehension: verbalized understanding and returned demonstration     HOME EXERCISE PROGRAM: Access Code: [email or web address] URL: [email or web address] Date: 03/13/2022 Prepared by: Kearney Hard   Exercises Supine Piriformis Stretch with Foot on Ground - 2 x daily - 7 x weekly - 3-5 reps - 20 seconds hold Supine Figure 4 Piriformis Stretch - 2 x daily - 7 x weekly - 3 sets - 3-5 reps - 20 seocnds hold Hip Flexor Stretch at Edge of Bed - 1 x daily - 7 x weekly - 3 sets - 3-5 reps - 20 seconds hold Supine Bridge - 2 x daily - 7 x weekly - 2 sets - 10 reps - 5 seconds hold Supine  Lower Trunk Rotation - 2-3 x daily - 7 x weekly - 3-5 reps - 20 seconds hold   ASSESSMENT:   CLINICAL IMPRESSION: 04/11/22  Slightly shorter Session as she had to leave early. In general she is progressing with hip stability and strength but still with some glute pain and pain at SIJ. I did trial percussive device today to see if this helps any.  03/13/2022:  Patient is a 41 y.o. female who comes to clinic with complaints of left buttock pain  s/p fall down 5 steps at home on her bottom. Pt with seroma which has been drained. Pt with mobility, strength and movement coordination deficits that impair their ability to perform usual daily and recreational functional activities without increase difficulty/symptoms at this time.  Patient to benefit from skilled PT services to address impairments and limitations to improve to previous level of function without restriction secondary to condition.      OBJECTIVE IMPAIRMENTS decreased balance, decreased mobility, difficulty walking, decreased ROM, decreased strength, increased edema, and pain.    ACTIVITY LIMITATIONS community activity and occupation.    PERSONAL FACTORS fitness are also affecting patient's functional outcome.      REHAB POTENTIAL: Good   CLINICAL DECISION MAKING: Stable/uncomplicated   EVALUATION COMPLEXITY: Low     GOALS: Goals reviewed with patient? Yes Short term PT Goals (target date for Short term goals are 3 weeks 04/03/2022) Patient will demonstrate independent use of home exercise program to maintain progress from in clinic treatments. Goal status: reviewed for understanding and technique on 04/01/22   Long term PT goals (target dates for all long term goals are 6 weeks  05/03/2022 ) Patient will demonstrate/report pain at worst less than or equal to 2/10 to facilitate minimal limitation in daily activity secondary to pain symptoms. Goal status: New   Patient will demonstrate independent use of home exercise program to  facilitate ability to maintain/progress functional gains from skilled physical therapy services. Goal status: New   Patient will demonstrate FOTO outcome > or = 79% % to indicate reduced disability due to condition. Goal status: New   Pt will be able to fast walk for 5 minutes with SI pain </= 2/10.  Goal status: New       5.  Pt will be able to perform full squat with no pain noted.  Goal status: New     PLAN: PT FREQUENCY: 1x/week   PT DURATION: 6 weeks   PLANNED INTERVENTIONS:  Therapeutic exercises, Therapeutic activity, Neuro Muscular re-education, Balance training, Gait training, Patient/Family education, Joint mobilization, Stair training, DME instructions, Dry Needling, Electrical stimulation, Cryotherapy, Moist heat, Taping, Ultrasound, Ionotophoresis 15m/ml Dexamethasone, and Manual therapy.  All included unless contraindicated   PLAN FOR NEXT SESSION: how was percussive device. consider muscle energy techniques, pelvic mobs and or DN, progress hip and core strengthening as able   BDebbe Odea PT,DPT 04/11/2022, 3:25 PM   PHYSICAL THERAPY DISCHARGE SUMMARY  Visits from Start of Care: 3  Current functional level related to goals / functional outcomes: See above   Remaining deficits: See above   Education / Equipment: HEP   Patient agrees to discharge. Patient goals were not met. Patient is being discharged due to not returning since the last visit.

## 2022-04-24 NOTE — Progress Notes (Signed)
? ?I, Wendy Poet, LAT, ATC, am serving as scribe for Dr. [name]. ? ?Anna Cabrera is a 41 y.o. female who presents to [place] at [facility] today for f/u of L buttocks pain due to a seroma that occurred after falling down some steps on 09/17/21, landing on her buttocks.  She was last seen by Dr. Georgina Snell on 02/28/22 and noted con't pain and tenderness in the area despite having an aspiration/injection on 01/30/22.  She was referred to PT of which she's completed 3 visits.  Today, pt reports that she has finished PT.  She feels that her sacral discomfort has improved somewhat but still has tenderness when pressure is applied to that area.  She con't to have pain in the area of the seroma especially depending on the surface on which she lays.  She is frustrated by the fact that she con't to have the seroma and is curious if she is going to have to live w/ this as is or if something else can be done. ? ?Diagnostic testing: L-spine and sacrum/coccyx XR- 09/25/21 ? ?Pertinent review of systems: No fevers or chills ? ?Relevant historical information: Migraine and obesity. ? ? ?Exam:  ?BP 116/82 (BP Location: Left Arm, Patient Position: Sitting, Cuff Size: Large)   Pulse 75   Wt 255 lb 3.2 oz (115.8 kg)   SpO2 95%   BMI 38.80 kg/m?  ?General: Well Developed, well nourished, and in no acute distress.  ? ?MSK: Left buttocks: Normal-appearing ?Mildly tender to palpation. ?Normal hip motion. ? ? ?Lab and Radiology Results ? ?Procedure: Real-time Ultrasound Guided aspiration and injection of left buttock seroma ?Device: Philips Affiniti 50G ?Images permanently stored and available for review in PACS ?Small remaining seroma visible left buttocks measuring about 4 cm in length x 1 cm in height ?Verbal informed consent obtained.  Discussed risks and benefits of procedure. Warned about infection, bleeding, hyperglycemia damage to structures among others. ?Patient expresses understanding and  agreement ?Time-out conducted.   ?Noted no overlying erythema, induration, or other signs of local infection.   ?Skin prepped in a sterile fashion.   ?Local anesthesia: Topical Ethyl chloride.   ?With sterile technique and under real time ultrasound guidance: 3 mL of lidocaine injected into the cutaneous tissue and along plan aspiration tract into seroma. ?18-gauge needle used to access the seroma.  ?30 mL of cloudy fluid aspirated. ?Syringe exchanged and 40 mg of Kenalog and 2 mL of Marcaine injected into the now decompressed seroma sac.Marland Kitchen Fluid seen entering the seroma sac.   ?Completed without difficulty   ?Pain immediately resolved suggesting accurate placement of the medication.   ?Advised to call if fevers/chills, erythema, induration, drainage, or persistent bleeding.   ?Images permanently stored and available for review in the ultrasound unit.  ?Impression: Technically successful ultrasound guided injection. ? ? ? ?Assessment and Plan: ?41 y.o. female with residual left buttock seroma.  Plan for aspiration and injection.  Because the fluid I aspirated is more cloudier than I expected will send for fluid analysis and culture.  If this becomes recurrent may need a drain put in by IR. ?Recheck in 1 month. ? ? ?PDMP not reviewed this encounter. ?Orders Placed This Encounter  ?Procedures  ? Anaerobic and Aerobic Culture  ?  Culture of fluid from seroma on L buttock  ?  Standing Status:   Future  ?  Standing Expiration Date:   05/26/2022  ? [place] LIMITED JOINT SPACE STRUCTURES LOW LEFT(NO LINKED CHARGES)  ?  Order Specific Question:   Reason for Exam (SYMPTOM  OR DIAGNOSIS REQUIRED)  ?  Answer:   lt seroma  ?  Order Specific Question:   Preferred imaging location?  ?  Answer:   [place]  ? Synovial Fluid Analysis, Complete  ?  Analysis of seroma fluid from L buttocks  ?  Standing Status:   Future  ?  Standing Expiration Date:   05/26/2022  ? ?No orders of the defined types were placed in this  encounter. ? ? ? ?Discussed warning signs or symptoms. Please see discharge instructions. Patient expresses understanding. ? ? ?The above documentation has been reviewed and is accurate and complete [name], M.D. ? ? ?

## 2022-04-25 ENCOUNTER — Ambulatory Visit: Payer: Self-pay

## 2022-04-25 ENCOUNTER — Encounter: Payer: Self-pay | Admitting: Family Medicine

## 2022-04-25 ENCOUNTER — Ambulatory Visit (INDEPENDENT_AMBULATORY_CARE_PROVIDER_SITE_OTHER): Payer: No Typology Code available for payment source | Admitting: Family Medicine

## 2022-04-25 VITALS — BP 116/82 | HR 75 | Wt 255.2 lb

## 2022-04-25 DIAGNOSIS — T792XXA Traumatic secondary and recurrent hemorrhage and seroma, initial encounter: Secondary | ICD-10-CM

## 2022-04-25 NOTE — Patient Instructions (Addendum)
Good to see you today. ? ?You had a L buttocks seroma aspiration and injection.  Call or go to the ER if you develop a large red swollen joint with extreme pain or oozing puss.  ? ? ?Follow-up: one month ?

## 2022-04-25 NOTE — Addendum Note (Signed)
Addended by: Rodolph Bong on: 04/25/2022 10:08 AM ? ? Modules accepted: Orders ? ?

## 2022-05-01 LAB — ANAEROBIC AND AEROBIC CULTURE
AER RESULT:: NO GROWTH
MICRO NUMBER:: 13351918
MICRO NUMBER:: 13351919
SPECIMEN QUALITY:: ADEQUATE
SPECIMEN QUALITY:: ADEQUATE

## 2022-05-02 NOTE — Progress Notes (Signed)
Culture came back negative.  This indicates that we did not grow any bacteria from the fluid removed from your seroma.

## 2022-05-29 NOTE — Progress Notes (Deleted)
   I, Christoper Fabian, LAT, ATC, am serving as scribe for Dr. Clementeen Graham.  Anna Cabrera is a 41 y.o. female who presents to [facility] today for f/u of L buttocks pain due to a seroma that occurred after falling down some steps on 09/17/21, landing on her buttocks.  She was last seen by Dr. Denyse Amass on 04/25/22 and noted con't pain in the area of the seroma despite having an aspiration/injection on 01/30/22 and having completed 3 PT visits.  She had another aspiration/injection at her last visit and fluid was sent for analysis that came back negative for infection.  Today, pt reports   Diagnostic testing: L-spine and sacrum/coccyx XR- 09/25/21  Pertinent review of systems: ***  Relevant historical information: ***   Exam:  There were no vitals taken for this visit. General: Well Developed, well nourished, and in no acute distress.   MSK: ***    Lab and Radiology Results No results found for this or any previous visit (from the past 72 hour(s)). No results found.     Assessment and Plan: 41 y.o. female with ***   PDMP not reviewed this encounter. No orders of the defined types were placed in this encounter.  No orders of the defined types were placed in this encounter.    Discussed warning signs or symptoms. Please see discharge instructions. Patient expresses understanding.   ***

## 2022-05-30 ENCOUNTER — Ambulatory Visit: Payer: No Typology Code available for payment source | Admitting: Family Medicine

## 2022-06-28 ENCOUNTER — Encounter: Payer: Self-pay | Admitting: Internal Medicine

## 2022-06-28 ENCOUNTER — Ambulatory Visit (INDEPENDENT_AMBULATORY_CARE_PROVIDER_SITE_OTHER): Payer: No Typology Code available for payment source | Admitting: Internal Medicine

## 2022-06-28 ENCOUNTER — Ambulatory Visit (HOSPITAL_BASED_OUTPATIENT_CLINIC_OR_DEPARTMENT_OTHER)
Admission: RE | Admit: 2022-06-28 | Discharge: 2022-06-28 | Disposition: A | Discharge status: Home / Self Care | Payer: No Typology Code available for payment source | Source: Ambulatory Visit | Attending: Internal Medicine | Admitting: Internal Medicine

## 2022-06-28 VITALS — BP 120/82 | HR 90 | Temp 98.0°F | Resp 18 | Ht 68.0 in | Wt 257.5 lb

## 2022-06-28 DIAGNOSIS — M7989 Other specified soft tissue disorders: Secondary | ICD-10-CM | POA: Diagnosis present

## 2022-06-28 NOTE — Patient Instructions (Addendum)
GO TO THE LAB : Get the blood work     GO TO THE FRONT DESK, PLEASE SCHEDULE YOUR APPOINTMENTS Come back for schedule a physical exam at your convenience, you are due.   STOP BY THE FIRST FLOOR:  get the XR     For pain and swelling: IBUPROFEN (Advil or Motrin) 200 mg 2 tablets every 6 hours as needed for pain.  Always take it with food because may cause gastritis and ulcers.  If you notice nausea, stomach pain, change in the color of stools --->  Stop the medicine and let us know  Leg elevation as you are doing  Ice the area twice daily  Call if not gradually better

## 2022-06-28 NOTE — Progress Notes (Unsigned)
   Subjective:    Patient ID: Anna Cabrera, female    DOB: 1981/11/29, 41 y.o.   MRN: [id number]  DOS:  06/28/2022 Type of visit - description: Acute  Complaining of pain and swelling for the last 2 weeks at the left foot. Symptoms decreased with leg elevation and increased with standing. Had similar symptoms before but very sporadically, now they are consistent for the last 2 weeks.  No fever chills No injury No redness or warmness at the left foot. Denies any recent prolonged car trips or airplane trip. No calf pain. No chest pain no difficulty breathing  Review of Systems See above   Past Medical History:  Diagnosis Date   GERD (gastroesophageal reflux disease)    H/O headache    HPV in female     Past Surgical History:  Procedure Laterality Date   BREAST REDUCTION SURGERY  2004   CESAREAN SECTION  2011   CYST REMOVAL HAND Right 1996    Current Outpatient Medications  Medication Instructions   ibuprofen (ADVIL) 400 mg, As needed   levonorgestrel (MIRENA) 20 MCG/24HR IUD 1 each,  Once       Objective:   Physical Exam Musculoskeletal:       Feet:  Feet:     Comments: TTP at the dorsum of the left foot   BP 120/82   Pulse 90   Temp 98 F (36.7 C) (Oral)   Resp 18   Ht 5\' 8"  (1.727 m)   Wt 257 lb 8 oz (116.8 kg)   SpO2 99%   BMI 39.15 kg/m  General:   Well developed, NAD, BMI noted. HEENT:  Normocephalic . Face symmetric, atraumatic Lungs:  CTA B Normal respiratory effort, no intercostal retractions, no accessory muscle use. Heart: RRR,  no murmur.  Lower extremities:  Calves symmetric, no TTP R ankle and foot: Normal L foot and ankle: Mild edema without warmness from the dorsum of the foot distally. + TTP, see graphic Ankle with no TTP, swelling or redness Skin: Not pale. Not jaundice Neurologic:  alert & oriented X3.  Speech normal, gait appropriate for age and unassisted Psych--  Cognition and judgment appear intact.  Cooperative  with normal attention span and concentration.  Behavior appropriate. No anxious or depressed appearing.      Assessment    Assessment GERD HPV History of abnormal chest x-ray History of migraines Obesity, BMI 38 IUD FH hypertrophic cardiomyopathy: Echo -2019, recheck echo every 3 to 5 years   PLAN Foot pain: As described above, no injury. Stress fracture?  Gout?12[birth year]11[birth year]1978  On clinical grounds very low suspicious for DVT. Plan:  X-ray, CMP, CBC, uric acid Treat with ibuprofen with GI precautions, foot elevation and ice. If not better may need further eval.  She actually has an appointment to see the sports medicine doctor next week.

## 2022-06-29 LAB — CBC WITH DIFFERENTIAL/PLATELET
Absolute Monocytes: 761 cells/uL (ref 200–950)
Basophils Absolute: 85 cells/uL (ref 0–200)
Basophils Relative: 0.9 %
Eosinophils Absolute: 160 cells/uL (ref 15–500)
Eosinophils Relative: 1.7 %
HCT: 39.2 % (ref 35.0–45.0)
Hemoglobin: 13.3 g/dL (ref 11.7–15.5)
Lymphs Abs: 2989 cells/uL (ref 850–3900)
MCH: 30 pg (ref 27.0–33.0)
MCHC: 33.9 g/dL (ref 32.0–36.0)
MCV: 88.5 fL (ref 80.0–100.0)
MPV: 10.2 fL (ref 7.5–12.5)
Monocytes Relative: 8.1 %
Neutro Abs: 5405 cells/uL (ref 1500–7800)
Neutrophils Relative %: 57.5 %
Platelets: 333 10*3/uL (ref 140–400)
RBC: 4.43 10*6/uL (ref 3.80–5.10)
RDW: 12.5 % (ref 11.0–15.0)
Total Lymphocyte: 31.8 %
WBC: 9.4 10*3/uL (ref 3.8–10.8)

## 2022-06-29 LAB — COMPREHENSIVE METABOLIC PANEL
AG Ratio: 2.3 (calc) (ref 1.0–2.5)
ALT: 14 U/L (ref 6–29)
AST: 13 U/L (ref 10–30)
Albumin: 4.5 g/dL (ref 3.6–5.1)
Alkaline phosphatase (APISO): 30 U/L — ABNORMAL LOW (ref 31–125)
BUN: 9 mg/dL (ref 7–25)
CO2: 23 mmol/L (ref 20–32)
Calcium: 9.4 mg/dL (ref 8.6–10.2)
Chloride: 106 mmol/L (ref 98–110)
Creat: 0.88 mg/dL (ref 0.50–0.99)
Globulin: 2 g/dL (calc) (ref 1.9–3.7)
Glucose, Bld: 77 mg/dL (ref 65–99)
Potassium: 4.6 mmol/L (ref 3.5–5.3)
Sodium: 140 mmol/L (ref 135–146)
Total Bilirubin: 0.5 mg/dL (ref 0.2–1.2)
Total Protein: 6.5 g/dL (ref 6.1–8.1)

## 2022-06-29 LAB — URIC ACID: Uric Acid, Serum: 4.1 mg/dL (ref 2.5–7.0)

## 2022-06-30 NOTE — Assessment & Plan Note (Signed)
L Foot pain:  As described above, no injury. Stress fracture?  Gout?Marland Kitchen  On clinical grounds very low suspicious for DVT. Plan:  X-ray, CMP, CBC, uric acid Treat with ibuprofen with GI precautions, foot elevation and ice. If not better may need further eval.  She actually has an appointment to see the sports medicine doctor next week.

## 2022-07-04 ENCOUNTER — Ambulatory Visit: Payer: Self-pay

## 2022-07-04 ENCOUNTER — Encounter: Payer: Self-pay | Admitting: Family Medicine

## 2022-07-04 ENCOUNTER — Ambulatory Visit (INDEPENDENT_AMBULATORY_CARE_PROVIDER_SITE_OTHER): Payer: No Typology Code available for payment source | Admitting: Family Medicine

## 2022-07-04 VITALS — BP 116/82 | HR 95 | Ht 68.0 in | Wt 254.8 lb

## 2022-07-04 DIAGNOSIS — T792XXA Traumatic secondary and recurrent hemorrhage and seroma, initial encounter: Secondary | ICD-10-CM

## 2022-07-04 NOTE — Patient Instructions (Addendum)
Good to see you today.  I've referred you to interventional radiology for a consultation.  Please call [phone].  Please purchase a post-op shoe at [facility] for your foot.  Follow-up: as needed

## 2022-07-04 NOTE — Progress Notes (Signed)
   I, Christoper Fabian, LAT, ATC, am serving as scribe for Dr. Clementeen Graham.  Anna Cabrera is a 41 y.o. female who presents to [facility] at [facility] today for cont L buttocks pain due to a seroma that occurred after falling down some steps on 09/17/21, landing on her buttocks.  Pt previously completed 3 PT visits. She was last seen by Dr. Denyse Amass on 04/25/22 and the L buttock seroma was aspirated, injection, and fluid cultured. Today, pt reports that she con't to have pain in the area of seroma.  She states that the "bump" remains and is annoying and frustrating and wants to know what can be done to get rid of the bump.  Dx testing: 04/25/22 Fluid culture 09/25/21 Sacrum/coccyx & L-spine XR  Pertinent review of systems: No fevers or chills  Relevant historical information: Migraine     Exam:  BP 116/82 (BP Location: Right Arm, Patient Position: Sitting, Cuff Size: Large)   Pulse 95   Ht 5\' 8"  (1.727 m)   Wt 254 lb 12.8 oz (115.6 kg)   SpO2 97%   BMI 38.74 kg/m  General: Well Developed, well nourished, and in no acute distress.   MSK: Left buttocks: Palpable mass deep within the left buttocks.  Mildly tender palpation.  Normal hip motion.    Lab and Radiology Results Diagnostic Limited MSK Ultrasound of: Left buttocks Shaped hypoechoic fluid collection about 4 cm deep within the buttocks measuring 1 x 5 cm. Impression: Left buttocks seroma     Assessment and Plan: 41 y.o. female with patient has developed a recurrent seroma of the left buttocks.  I have now drained and injected the seroma twice.  The most recent aspiration attempt revealed cloudy purulent appearing fluid.  I did send this to culture but the culture was negative.  The seroma has returned and is bothersome.  I think at this point she may benefit from an insertion of a drain to allow more thorough drainage of the seroma.  Additionally I would recommend a repeat culture attempt. Refer to IR to discuss  the possibility of temporary insertion of a drain. If that is not feasible we will have a surgical consultation as well.   PDMP not reviewed this encounter. Orders Placed This Encounter  Procedures   45 LIMITED JOINT SPACE STRUCTURES LOW LEFT(NO LINKED CHARGES)    Order Specific Question:   Reason for Exam (SYMPTOM  OR DIAGNOSIS REQUIRED)    Answer:   left buttock pain    Order Specific Question:   Preferred imaging location?    Answer:   [facility] referral to Interventional Radiology    Referral Priority:   Routine    Referral Type:   Consultation    Referral Reason:   Specialty Services Required    Requested Specialty:   Interventional Radiology    Number of Visits Requested:   1   No orders of the defined types were placed in this encounter.    Discussed warning signs or symptoms. Please see discharge instructions. Patient expresses understanding.   The above documentation has been reviewed and is accurate and complete [facility], M.D.

## 2022-07-08 ENCOUNTER — Telehealth: Payer: Self-pay | Admitting: Family Medicine

## 2022-07-08 DIAGNOSIS — T792XXA Traumatic secondary and recurrent hemorrhage and seroma, initial encounter: Secondary | ICD-10-CM

## 2022-07-08 NOTE — Telephone Encounter (Signed)
I received a message from Antony Salmon at [facility] Interventional Radiology and Tacey Ruiz at [facility] in regards to the IR order for eval and drain placement. They state that an order will need to be placed if you are wanting a CT Drain.  If so, it would be CT/US Drain placement depending on what IR review says.   You can start with [id number] Ct Drain placement and then Tonya at [facility] will go from there.

## 2022-07-09 NOTE — Telephone Encounter (Signed)
Appropriate CT drain placement order placed.

## 2022-07-23 ENCOUNTER — Other Ambulatory Visit: Payer: Self-pay | Admitting: Family Medicine

## 2022-07-23 ENCOUNTER — Other Ambulatory Visit: Payer: Self-pay | Admitting: Internal Medicine

## 2022-07-23 DIAGNOSIS — T792XXA Traumatic secondary and recurrent hemorrhage and seroma, initial encounter: Secondary | ICD-10-CM

## 2022-07-25 NOTE — Progress Notes (Signed)
Patient on schedule for US aspiration seroma/vs drain placement 8/10, called and spoke with patient on phone with pre procedure instructions given. Since patient may need drain placement, made aware to be here at 0900, NPO after MN prior to procedure in case drain needed and driver post procedure/recovery/discharge. Stated understanding.

## 2022-07-31 ENCOUNTER — Other Ambulatory Visit (HOSPITAL_COMMUNITY): Payer: Self-pay | Admitting: Physician Assistant

## 2022-07-31 DIAGNOSIS — Z01818 Encounter for other preprocedural examination: Secondary | ICD-10-CM

## 2022-08-01 ENCOUNTER — Other Ambulatory Visit: Payer: Self-pay | Admitting: Radiology

## 2022-08-01 ENCOUNTER — Other Ambulatory Visit: Payer: Self-pay | Admitting: Family Medicine

## 2022-08-01 ENCOUNTER — Ambulatory Visit
Admission: RE | Admit: 2022-08-01 | Discharge: 2022-08-01 | Disposition: A | Discharge status: Home / Self Care | Payer: No Typology Code available for payment source | Source: Ambulatory Visit | Attending: Family Medicine | Admitting: Family Medicine

## 2022-08-01 DIAGNOSIS — X58XXXA Exposure to other specified factors, initial encounter: Secondary | ICD-10-CM | POA: Insufficient documentation

## 2022-08-01 DIAGNOSIS — T792XXA Traumatic secondary and recurrent hemorrhage and seroma, initial encounter: Secondary | ICD-10-CM | POA: Insufficient documentation

## 2022-08-01 MED ORDER — FENTANYL CITRATE (PF) 100 MCG/2ML IJ SOLN
INTRAMUSCULAR | Status: AC
  Filled 2022-08-01: qty 2

## 2022-08-01 MED ORDER — MIDAZOLAM HCL 2 MG/2ML IJ SOLN
INTRAMUSCULAR | Status: AC
  Filled 2022-08-01: qty 2

## 2022-08-01 NOTE — Procedures (Signed)
Interventional Radiology Procedure Note  Procedure: Ultrasound guided aspiration of left buttock fluid collection  Findings: Please refer to procedural dictation for full description.  Approximately 30 mL of serous fluid aspirated from left buttock.  Fluid sent for culture and triglycerides.  Complications: None immediate  Estimated Blood Loss: < 5 mL  Recommendations: Follow up in IR clinic with me in 1-2 weeks to review fluid study results and discuss potential advanced imaging to fully evaluate this complex fluid collection.  Will discuss options at that time such as sclerotherapy or potential Plastic Surgery referral.   Marliss Coots, MD Pager: [phone]

## 2022-08-02 LAB — TRIGLYCERIDES, BODY FLUIDS: Triglycerides, Fluid: 19 mg/dL

## 2022-08-06 LAB — AEROBIC/ANAEROBIC CULTURE W GRAM STAIN (SURGICAL/DEEP WOUND): Culture: NO GROWTH

## 2022-08-06 NOTE — Progress Notes (Signed)
Interventional radiology suspects this may be a Morel-Lavallee type lesion.  She feels that this comes back we should do an MRI to look at it more accurately.  The best way to treat this looks to be surgery if it keeps coming back.   "Treatment Once the lesion is identified, the hematoma should be evacuated and necrotic material removed. Neglected lesions can become infected[8,9] and progression of lesion may lead to extensive skin necrosis. Conservative treatment with compression can be utilized for small acute lesions without a definite capsule.[10] Presence of a capsule would render conservative or percutaneous treatment unsuccessful, thus leading to recurrence if not managed surgically.[11] Percutaneous drainage and sclerodesis using talc and doxycycline have been reported to be effective.[12]  Conclusion Morel-Lavalle lesions are post-traumatic, closed degloving injuries occurring in the subcutaneous plane superficial to the muscle plane due to disruption of capillaries resulting in an effusion containing hemolymph and necrotic fat. MRI is the modality of choice in the evaluation of Morel-Lavalle lesion. Early diagnosis and management is essential. Presence of a capsule indicates the choice of surgery over conservative management of the lesion.

## 2022-08-12 ENCOUNTER — Telehealth: Payer: Self-pay | Admitting: Family Medicine

## 2022-08-12 DIAGNOSIS — T792XXA Traumatic secondary and recurrent hemorrhage and seroma, initial encounter: Secondary | ICD-10-CM

## 2022-08-12 NOTE — Telephone Encounter (Signed)
Pt would like to proceed with MRI for the Morel-Lavallee lesion as per our recommendation.  Can be [facility] or [facility].

## 2022-08-13 NOTE — Telephone Encounter (Signed)
MRI ordered to [facility].

## 2022-08-27 ENCOUNTER — Ambulatory Visit
Admission: RE | Admit: 2022-08-27 | Discharge: 2022-08-27 | Disposition: A | Discharge status: Home / Self Care | Payer: No Typology Code available for payment source | Source: Ambulatory Visit | Attending: Family Medicine | Admitting: Family Medicine

## 2022-08-27 DIAGNOSIS — T792XXA Traumatic secondary and recurrent hemorrhage and seroma, initial encounter: Secondary | ICD-10-CM | POA: Insufficient documentation

## 2022-08-29 ENCOUNTER — Telehealth: Payer: Self-pay | Admitting: Family Medicine

## 2022-08-29 DIAGNOSIS — M7918 Myalgia, other site: Secondary | ICD-10-CM

## 2022-08-29 DIAGNOSIS — T792XXA Traumatic secondary and recurrent hemorrhage and seroma, initial encounter: Secondary | ICD-10-CM

## 2022-08-29 NOTE — Telephone Encounter (Signed)
I spoke with Anna Cabrera about her MRI. I also spoke with Dr Grace Isaac about IR possibilities with this recurrent seroma.  Plan for consultation with IR.

## 2022-08-29 NOTE — Progress Notes (Signed)
As we spoke on the phone MRI shows recurrent seroma.

## 2022-09-02 ENCOUNTER — Other Ambulatory Visit: Payer: Self-pay | Admitting: Family Medicine

## 2022-09-02 DIAGNOSIS — T792XXA Traumatic secondary and recurrent hemorrhage and seroma, initial encounter: Secondary | ICD-10-CM

## 2022-09-03 ENCOUNTER — Ambulatory Visit
Admission: RE | Admit: 2022-09-03 | Discharge: 2022-09-03 | Disposition: A | Discharge status: Home / Self Care | Payer: No Typology Code available for payment source | Source: Ambulatory Visit | Attending: Family Medicine | Admitting: Family Medicine

## 2022-09-03 ENCOUNTER — Other Ambulatory Visit (HOSPITAL_COMMUNITY): Payer: Self-pay | Admitting: Interventional Radiology

## 2022-09-03 ENCOUNTER — Ambulatory Visit
Admission: RE | Admit: 2022-09-03 | Discharge: 2022-09-03 | Disposition: A | Discharge status: Home / Self Care | Payer: No Typology Code available for payment source | Source: Ambulatory Visit | Attending: Interventional Radiology | Admitting: Interventional Radiology

## 2022-09-03 ENCOUNTER — Other Ambulatory Visit: Payer: Self-pay | Admitting: Interventional Radiology

## 2022-09-03 ENCOUNTER — Encounter: Payer: Self-pay | Admitting: *Deleted

## 2022-09-03 DIAGNOSIS — T792XXA Traumatic secondary and recurrent hemorrhage and seroma, initial encounter: Secondary | ICD-10-CM

## 2022-09-03 HISTORY — PX: IR RADIOLOGIST EVAL & MGMT: IMG5224

## 2022-09-03 NOTE — Consult Note (Signed)
Chief Complaint:  Recurring left buttock subcutaneous seroma  Referring Physician(s): Corey,Evan S  History of Present Illness: Anna Cabrera is a 41 y.o. female who is referred by [facility] sports medicine for a traumatic left buttock recurrent seroma.  This resulted from trauma after falling down steps nearly 1 year ago on 09/17/2021 landing on her buttocks  She reports that Dr. Denyse Amass has aspirated the left buttock seroma twice in the office.  A third aspiration was performed under ultrasound by my partner Dr. Elby Showers in interventional radiology.  Last ultrasound aspiration was performed August 10.  She reports that this has slowly recurred with mild associated pain and discomfort over the left buttock area.  The recurrence is somewhat annoying and frustrating for the patient.  No overlying signs of infection or cellulitis reported.  She also had an MRI scan of the pelvis 08/29/2022.  By MRI the lesion is favored to be a superficial subcutaneous posttraumatic seroma without a defined capsule.  She presents to discuss further management including aspiration versus drain catheter placement.  Past Medical History:  Diagnosis Date   GERD (gastroesophageal reflux disease)    H/O headache    HPV in female     Past Surgical History:  Procedure Laterality Date   BREAST REDUCTION SURGERY  2004   CESAREAN SECTION  2011   CYST REMOVAL HAND Right 1996   IR RADIOLOGIST EVAL & MGMT  09/03/2022    Allergies: Maxidone [hydrocodone-acetaminophen] and Ortho evra [norelgestromin-eth estradiol]  Medications: Prior to Admission medications   Medication Sig Start Date End Date Taking? Authorizing Provider  levonorgestrel (MIRENA) 20 MCG/24HR IUD 1 each by Intrauterine route once.      [provider]     Family History  Problem Relation Age of Onset   Migraines Mother    Cardiomyopathy Mother 70   Diabetes Father    Hypertension Father    CAD Father        CABG 73   High  Cholesterol Father    Migraines Father    Stroke Father    Stroke Paternal Grandmother    Cancer - Colon Neg Hx    Breast cancer Neg Hx    Cancer Neg Hx     Social History   Socioeconomic History   Marital status: Married    Spouse name: Radio producer   Number of children: 1   Years of education: Associates   Highest education level: Not on file  Occupational History   Occupation: Clinical cytogeneticist: [employer]  Tobacco Use   Smoking status: Former    Packs/day: 1.00    Years: 7.00    Total pack years: 7.00    Types: Cigarettes    Start date: 09/10/1996    Quit date: 03/16/2003    Years since quitting: 19.4   Smokeless tobacco: Never  Substance and Sexual Activity   Alcohol use: Yes    Alcohol/week: 0.0 standard drinks of alcohol    Comment: rarely - wine   Drug use: No   Sexual activity: Not on file  Other Topics Concern   Not on file  Social History Narrative   Lives at home with spouse and daughter   Right handed.   Caffeine use: Drinks 4-6 drinks per day (soda and tea)   Associated degree       [facility]:   Originally from [place]. Has always lived in [place]. Previously has traveled to [place], [place], [place], [place], [place], [place].  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 Always had a negative PPD test. Has a dog currently. No bird or mold. She has prior exposure to a hot tub appoximately 1 year ago. Enjoys reading.    Social Determinants of Health   Financial Resource Strain: Not on file  Food Insecurity: Not on file  Transportation Needs: Not on file  Physical Activity: Not on file  Stress: Not on file  Social Connections: Not on file     Review of Systems: A 12 point ROS discussed and pertinent positives are indicated in the HPI above.  All other systems are negative.  Review of Systems  Vital Signs: There were no vitals taken for this visit.    Physical Exam Constitutional:      General: She is not in acute  distress.    Appearance: She is not toxic-appearing.  Eyes:     General: No scleral icterus.    Conjunctiva/sclera: Conjunctivae normal.  Musculoskeletal:        General: No swelling, tenderness or deformity.     Comments: Left buttock area has minor fullness related to the recurrent small subcutaneous seroma by ultrasound.  No redness or induration.  Overlying skin intact.  No superficial bruising.  Neurological:     General: No focal deficit present.     Mental Status: She is alert.  Psychiatric:        Mood and Affect: Mood normal.        Thought Content: Thought content normal.         Imaging: US PELVIS LIMITED (TRANSABDOMINAL ONLY)  Result Date: 09/03/2022 CLINICAL DATA:  Left buttock subcutaneous traumatic seroma status post fall EXAM: LIMITED ULTRASOUND OF PELVIS TECHNIQUE: Limited transabdominal ultrasound examination of the pelvis was performed. COMPARISON:  08/27/2022, 08/01/2022 FINDINGS: Superficial soft tissue ultrasound performed of the posterior left buttock area of concern. This was correlated with the prior ultrasound as well as the MRI scan. As before, there is a slightly smaller similar appearing subcutaneous elongated thin fluid collection compatible with a traumatic soft tissue superficial seroma measuring 6.5 x 1.1 x 4.5 cm, previously 7.9 x 1.1 x 6.5 cm. No associated vascularity. No other regional soft tissue abnormality. IMPRESSION: Slightly smaller but persistent left buttock subcutaneous traumatic seroma. Measurements as above. PLAN: Repeat ultrasound aspiration will be scheduled later this week followed by 10-14 days of tight undergarments for compression therapy. Electronically Signed   By: Judie Petit.  Adreonna Yontz M.D.   On: 09/03/2022 10:46   IR Radiologist Eval & Mgmt  Result Date: 09/03/2022 Please refer to notes tab for details about interventional procedure. (Op Note)  MR PELVIS WO CONTRAST  Result Date: 08/29/2022 CLINICAL DATA:  Soft tissue mass of the pelvis.  Question deep Norma Fredrickson lesion. Fell down stairs 1 year ago. Since follows fluid-filled pocket on left buttock. Marker placed above area of interest. EXAM: MRI PELVIS WITHOUT CONTRAST TECHNIQUE: Multiplanar multisequence MR imaging of the pelvis was performed. No intravenous contrast was administered. COMPARISON:  Ultrasound guided left buttock fluid aspiration 08/01/2022 for recurrent subcutaneous fluid collection the developed after falling down steps; limited left buttock ultrasounds demonstrating small fluid collection 07/04/2022, 04/25/2022, 02/28/2022, 01/30/2022; sacrum and coccyx radiographs 09/25/2021 FINDINGS: Urinary Tract:  No focal urinary bladder wall thickening. Bowel: Mild scattered sigmoid diverticulosis without inflammatory changes to indicate acute diverticulitis. Normal appendix. Vascular/Lymphatic: No pelvic vascular aneurysm. No pelvic lymphadenopathy. Reproductive: The uterus is present. An IUD is seen within  the endometrial canal extending to the region of the fundus, appearing in appropriate position. No adnexal mass. Incidental note of 2.6 cm simple left ovarian cyst. No follow-up imaging is recommended. Numerous (at least 20) cysts in the region of the cervical canal suggesting nabothian cysts, measuring up to 11 mm. Other: Moderate anterior right L5-S1 disc space narrowing with moderate edematous marrow endplate degenerative changes. Minimal posterior L5-S1 disc space narrowing and minimal posterior disc bulge. Small posterior L5-S1 and posterior L4-5 annular disc tears. Musculoskeletal: No acute fracture or avascular necrosis is seen within the pelvis or either proximal femur. There is a decreased T1 increased T2 signal fluid collection within the mid to deep aspect of the left buttock subcutaneous fat measuring up to approximately 12 cm in greatest transverse dimension. This measures up to 1.7 cm in AP dimension at the lateral aspect but only 0.4 cm in AP dimension at the medial  aspect (axial images 5 through 26). This measures up to approximately 12 cm in craniocaudal dimension (sagittal series 8 image 26). No soft tissue nodularity. The origins of the bilateral sartorius, rectus femoris, and common hamstring tendons are intact. Mild to moderate right and mild left edema around the gluteus minimus tendon insertions, tendinosis and tenosynovitis. There is mild fluid bright signal within the distal right gluteus medius greater than gluteus minimus musculotendinous junctions, a small interstitial tear (axial series 5 images 32 through 38). The bilateral gluteus medius and iliopsoas tendon insertions are intact. No joint effusion within either hip.  No trochanteric bursitis. IMPRESSION: 1. There is a simple appearing fluid collection within the mid to deep left buttock subcutaneous fat with small AP dimension and broad transverse and craniocaudal dimensions. Reportedly this appeared after a fall downstairs approximately 11 months ago. This is not at the junction of the subcutaneous fat and fascia of the underlying gluteus maximus to specifically indicate a degloving injury of a "Morel-Lavallee" lesion, however this is favored to represent a posttraumatic seroma. No suspicious soft tissue nodularity is seen. 2. Mild-to-moderate right and mild left gluteus minimus insertional tenosynovitis/tendinosis. Small interstitial tears within the adjacent distal right gluteus medius greater than gluteus minimus musculotendinous junctions. Electronically Signed   By: Neita Garnet M.D.   On: 08/29/2022 09:09    Labs:  CBC: Recent Labs    06/28/22 1425  WBC 9.4  HGB 13.3  HCT 39.2  PLT 333    COAGS: No results for input(s): "INR", "APTT" in the last 8760 hours.  BMP: Recent Labs    06/28/22 1425  NA 140  K 4.6  CL 106  CO2 23  GLUCOSE 77  BUN 9  CALCIUM 9.4  CREATININE 0.88    LIVER FUNCTION TESTS: Recent Labs    06/28/22 1425  BILITOT 0.5  AST 13  ALT 14  PROT 6.5     Assessment and Plan:  Slightly smaller but recurrent superficial subcutaneous left buttock posttraumatic seroma, maximal dimension 6.5 cm.  Seroma has a simple appearance by ultrasound.  Treatment options reviewed including conservative management with compression undergarments and repeat ultrasound, additional ultrasound aspiration also with compression, as well as placement of an ultrasound percutaneous drain.  Drain discomfort and management discussed in detail with the patient.  All questions addressed.  After discussion she would like to proceed with a fourth ultrasound aspiration.  Plan: Scheduled for ultrasound aspiration only without sedation of the left buttock recurrent superficial seroma at [facility] on Friday.  Following aspiration, recommend tight undergarments for compression therapy  for 10 to 14 days.  Follow-up as needed for any recurrent symptoms.  Thank you for this interesting consult.  I greatly enjoyed meeting Anna Cabrera and look forward to participating in their care.  A copy of this report was sent to the requesting provider on this date.  Electronically Signed: Berdine Dance 09/03/2022, 11:16 AM   I spent a total of  40 Minutes   in face to face in clinical consultation, greater than 50% of which was counseling/coordinating care for with a symptomatic uncomfortable recurring left buttock posttraumatic seroma

## 2022-09-04 ENCOUNTER — Other Ambulatory Visit (HOSPITAL_COMMUNITY): Payer: Self-pay | Admitting: Interventional Radiology

## 2022-09-05 ENCOUNTER — Other Ambulatory Visit: Payer: Self-pay | Admitting: Radiology

## 2022-09-06 ENCOUNTER — Other Ambulatory Visit (HOSPITAL_COMMUNITY): Payer: Self-pay | Admitting: Interventional Radiology

## 2022-09-06 ENCOUNTER — Ambulatory Visit (HOSPITAL_COMMUNITY)
Admission: RE | Admit: 2022-09-06 | Discharge: 2022-09-06 | Disposition: A | Discharge status: Home / Self Care | Payer: No Typology Code available for payment source | Source: Ambulatory Visit | Attending: Interventional Radiology | Admitting: Interventional Radiology

## 2022-09-06 DIAGNOSIS — X58XXXA Exposure to other specified factors, initial encounter: Secondary | ICD-10-CM | POA: Diagnosis not present

## 2022-09-06 DIAGNOSIS — T792XXA Traumatic secondary and recurrent hemorrhage and seroma, initial encounter: Secondary | ICD-10-CM

## 2022-09-06 MED ORDER — LIDOCAINE HCL (PF) 1 % IJ SOLN
INTRAMUSCULAR | Status: AC
  Filled 2022-09-06: qty 30

## 2022-09-06 NOTE — Procedures (Signed)
Interventional Radiology Procedure Note  Procedure: [place] ASP LEFT BUTTOCK SEROMA ASPIRATION    Complications: None  Estimated Blood Loss:  0  Findings: 10CC CLEAR SEROUS SEROMA FLD    Sharen Counter, MD

## 2023-03-06 IMAGING — DX DG LUMBAR SPINE COMPLETE 4+V
5 series · 5 of 5 positions shown · non-contrast
Comparison: None.

CLINICAL DATA: Back pain after fall last week.

EXAM:
LUMBAR SPINE - COMPLETE 4+ VIEW

[l-spine obl (1 of 2)]
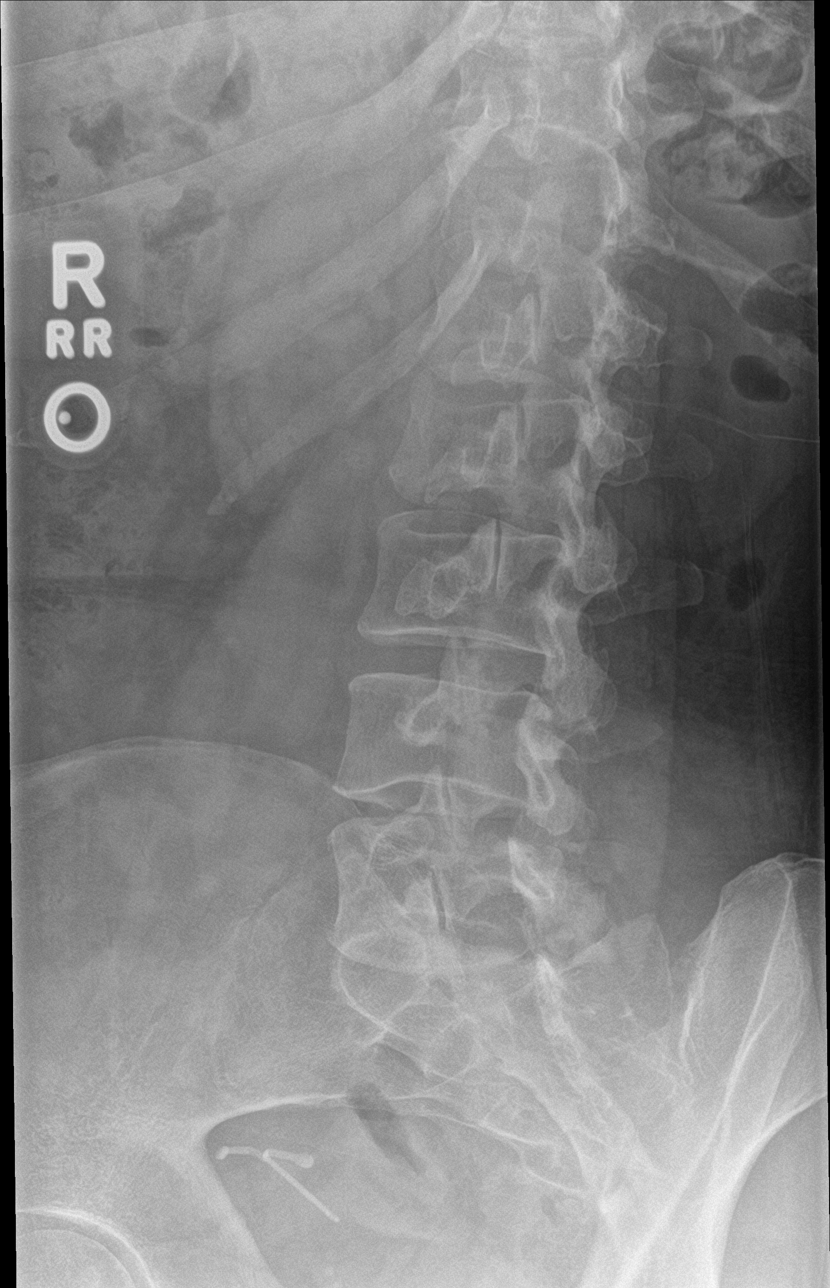

[l-spine obl (2 of 2)]
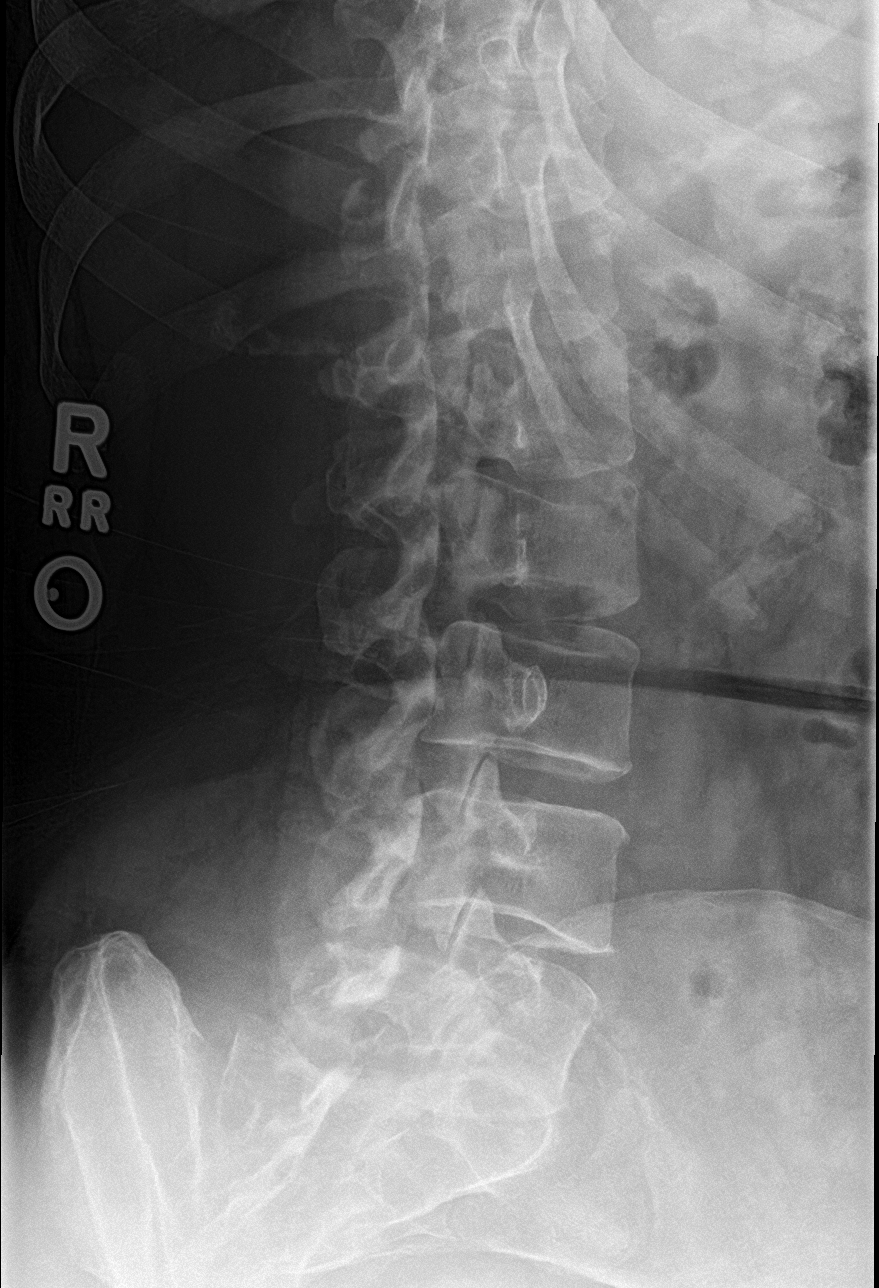

[l-spine lat]
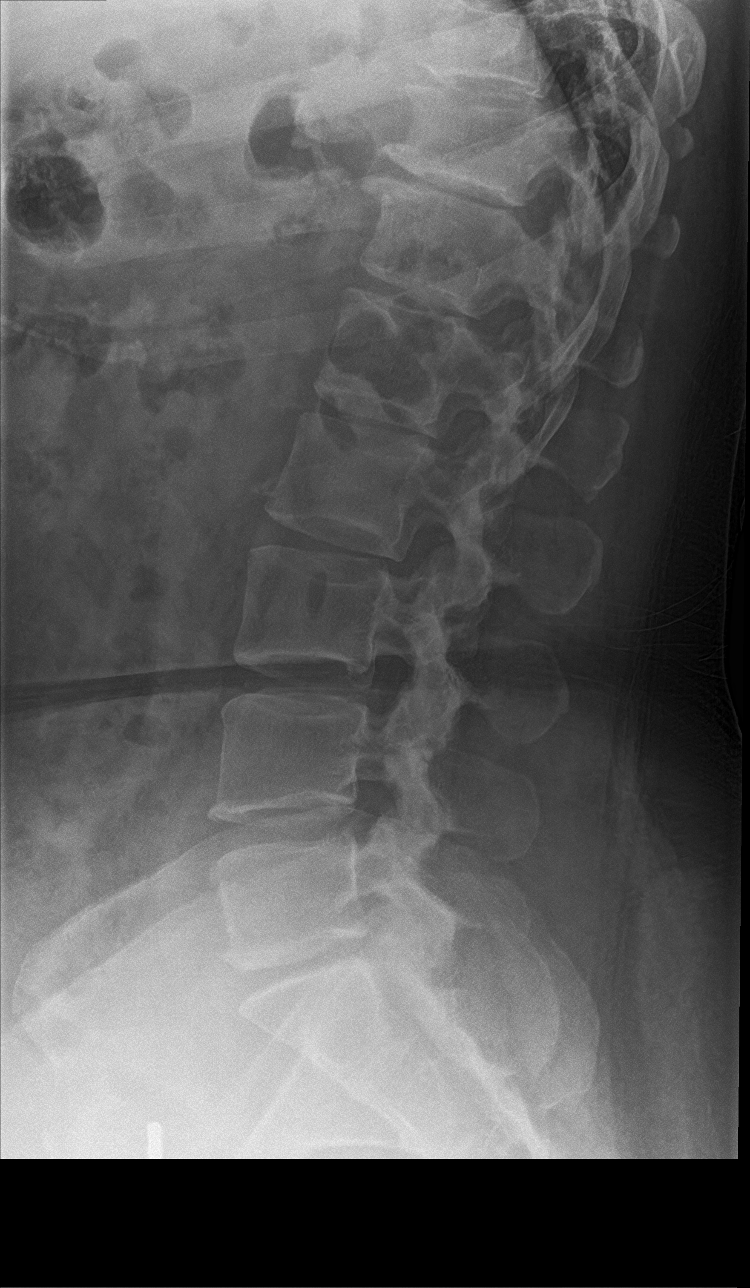

[l-spine spot]
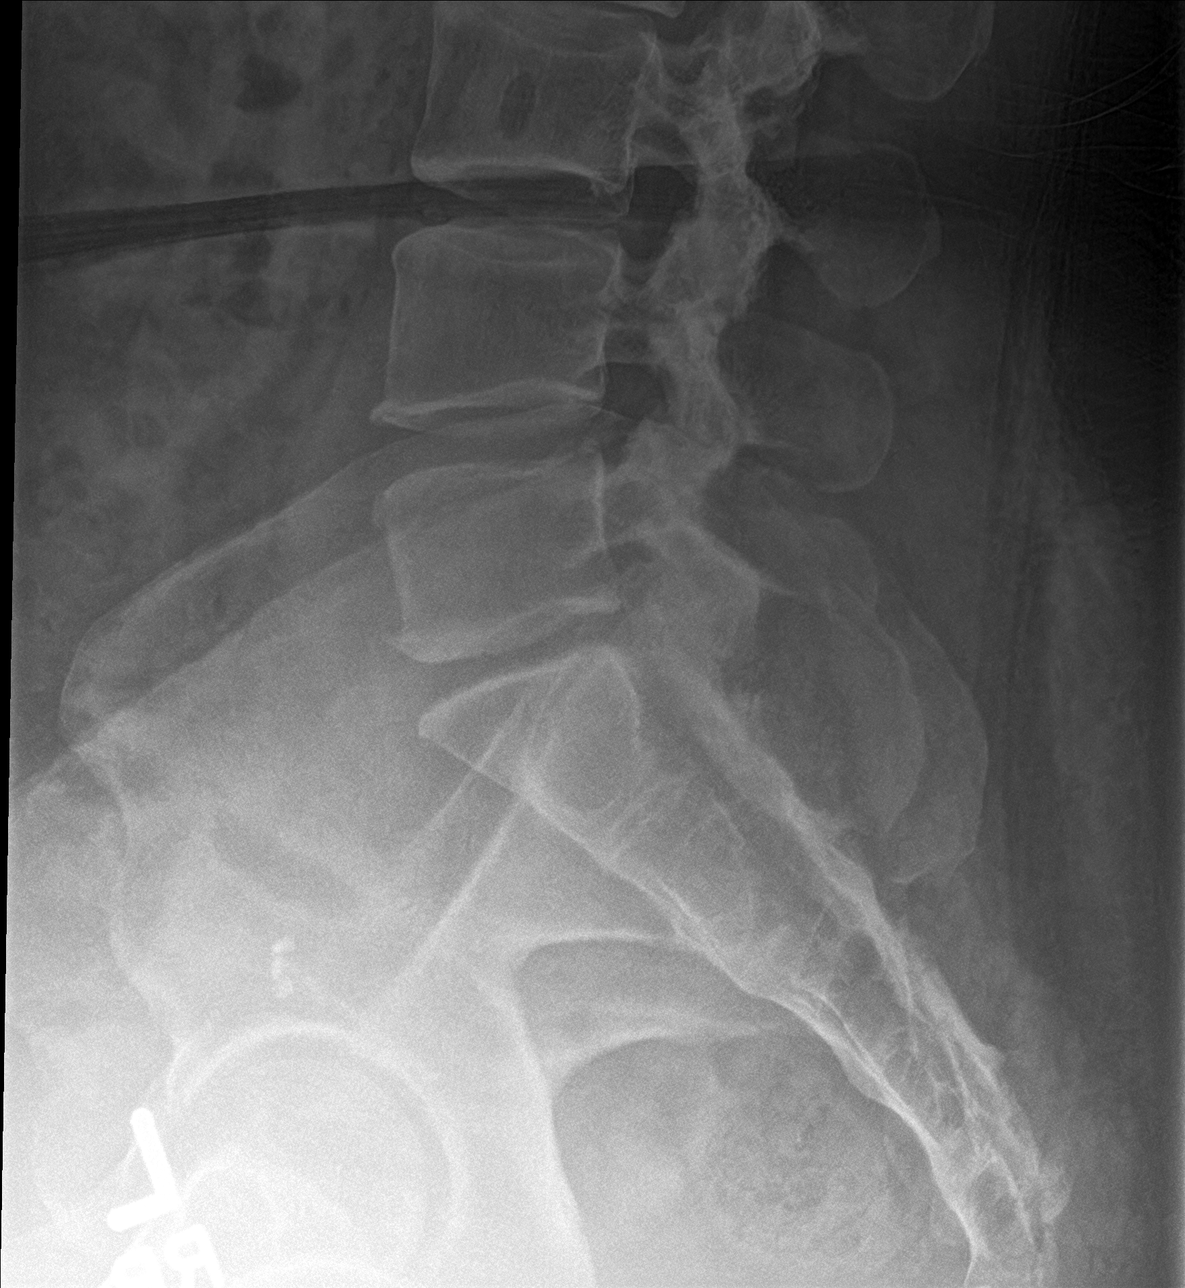

[l-spine ap]
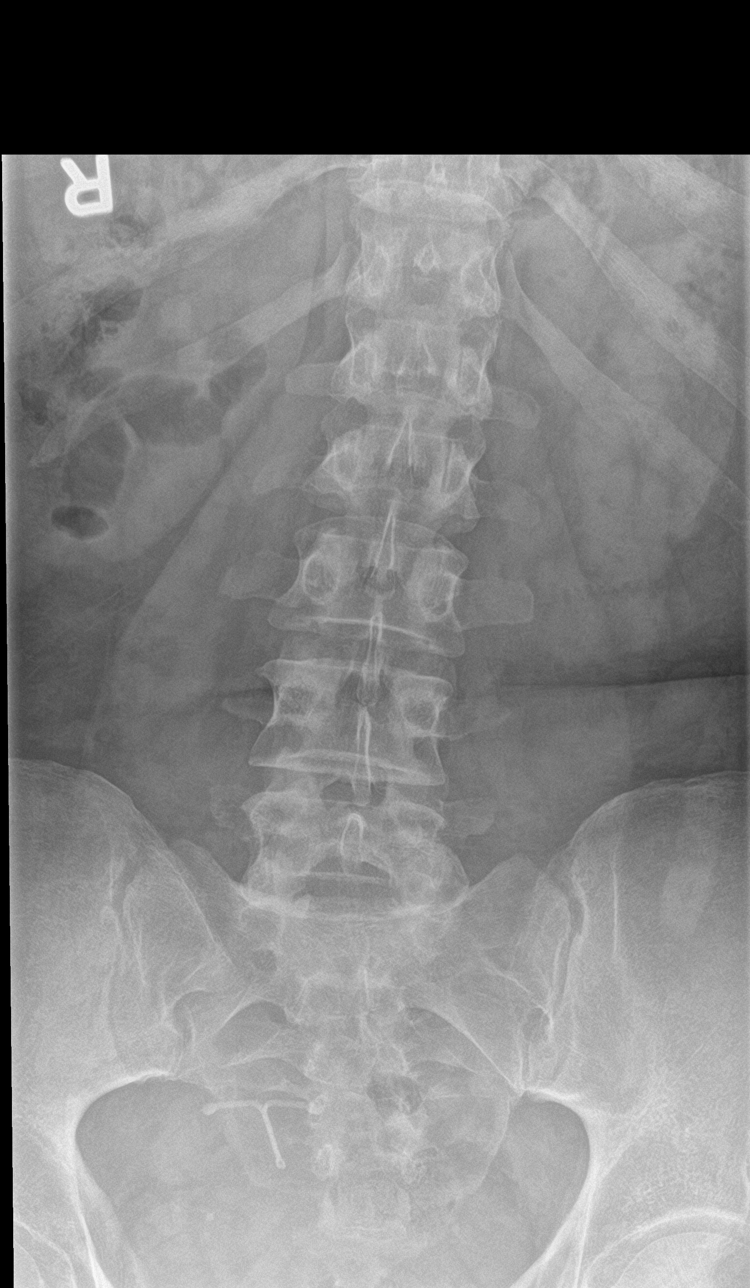

[5 of 5 positions shown; findings below may reference images not displayed]

FINDINGS: There is no evidence of lumbar spine fracture. Alignment is normal.
Minimal anterior osteophyte formation is noted at L4-5 and L5-S1.
IMPRESSION: Minimal multilevel degenerative changes.  No acute abnormality seen.

## 2023-08-05 ENCOUNTER — Ambulatory Visit (INDEPENDENT_AMBULATORY_CARE_PROVIDER_SITE_OTHER): Payer: 59 | Admitting: Internal Medicine

## 2023-08-05 ENCOUNTER — Encounter: Payer: Self-pay | Admitting: Internal Medicine

## 2023-08-05 VITALS — BP 122/80 | HR 70 | Temp 98.4°F | Resp 18 | Ht 68.0 in | Wt 259.6 lb

## 2023-08-05 DIAGNOSIS — R079 Chest pain, unspecified: Secondary | ICD-10-CM

## 2023-08-05 DIAGNOSIS — Z0000 Encounter for general adult medical examination without abnormal findings: Secondary | ICD-10-CM | POA: Diagnosis not present

## 2023-08-05 DIAGNOSIS — Z8249 Family history of ischemic heart disease and other diseases of the circulatory system: Secondary | ICD-10-CM

## 2023-08-05 DIAGNOSIS — Z23 Encounter for immunization: Secondary | ICD-10-CM

## 2023-08-05 NOTE — Assessment & Plan Note (Signed)
Here for CPX Tdap today  Rec COVID shots and flu shot this fall  CCS- never had a cscope  Female care,  pt states will schedule a MMG and gyn f/u Labs :   CMP FLP CBC TSH Patient education: Diet and exercise GERD: Not an issue L foot pain and mild swelling, since last year these have been persistent but stable. Chest pain: See HPI, quite atypical, EKG today: NSR, pain is atypical, recommend observation. FH hypertrophic cardiomyopathy: Was recommended to have an echo every 3 to 5 years, last echo 2019, plan: Cardiology referral, I wonder if something other than the echocardiogram is needed. RTC 1 year

## 2023-08-05 NOTE — Progress Notes (Signed)
Subjective:    Patient ID: Anna Cabrera, female    DOB: 1981-06-28, 42 y.o.   MRN: [id number]  DOS:  08/05/2023 Type of visit - description: CPX  Here for CPX. She is somewhat concerned about her family history of strokes and  hypertrophic cardiomyopathy. When asked, she admits that she is very sedentary, occasionally gets DOE going up stairs. In the last 10 days, she had an episode of chest pain on the left and then on the right, lasted few seconds, happening while she was taking her daughter to school. She denies pretibial edema, palpitations.    Review of Systems  Other than above, a 14 point review of systems is negative     Past Medical History:  Diagnosis Date   GERD (gastroesophageal reflux disease)    H/O headache    HPV in female     Past Surgical History:  Procedure Laterality Date   BREAST REDUCTION SURGERY  2004   CESAREAN SECTION  2011   CYST REMOVAL HAND Right 1996   IR RADIOLOGIST EVAL & MGMT  09/03/2022   Social History   Social History Narrative   Lives at home with spouse and daughter   Right handed.   Caffeine use: Drinks 4-6 drinks per day (soda and tea)   Associated degree       [facility]:   Originally from [place]. Has always lived in [place]. Previously has traveled to [place], [place], [place], [place], [place], [place]. 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 Always had a negative PPD test. Has a dog currently. No bird or mold. She has prior exposure to a hot tub appoximately 1 year ago. Enjoys reading.     Current Outpatient Medications  Medication Instructions   levonorgestrel (MIRENA) 20 MCG/24HR IUD 1 each,  Once       Objective:   Physical Exam BP 122/80 (BP Location: Left Arm, Patient Position: Sitting, Cuff Size: Large)   Pulse 70   Temp 98.4 F (36.9 C) (Temporal)   Resp 18   Ht 5\' 8"  (1.727 m)   Wt 259 lb 9.6 oz (117.8 kg)   SpO2 98%   BMI 39.47 kg/m  General: Well developed,  NAD, BMI noted Neck: No  thyromegaly  HEENT:  Normocephalic . Face symmetric, atraumatic Lungs:  CTA B Normal respiratory effort, no intercostal retractions, no accessory muscle use. Heart: RRR,  no murmur.  Abdomen:  Not distended, soft, non-tender. No rebound or rigidity.   Lower extremities: Minimal/trace periankle edema left.  No pretibial swelling. Skin: Exposed areas without rash. Not pale. Not jaundice Neurologic:  alert & oriented X3.  Speech normal, gait appropriate for age and unassisted Strength symmetric and appropriate for age.  Psych: Cognition and judgment appear intact.  Cooperative with normal attention span and concentration.  Behavior appropriate. No anxious or depressed appearing.     Assessment     Assessment GERD HPV History of abnormal chest x-ray History of migraines Obesity, BMI 38 IUD FH hypertrophic cardiomyopathy: Echo -2019, recheck echo every 3 to 5 years Left buttock seroma, persistent  PLAN Here for CPX Tdap today  Rec COVID shots and flu shot this fall  CCS- never had a cscope  Female care,  pt states will schedule a MMG and gyn f/u Labs :   CMP FLP CBC TSH Patient education: Diet and exercise GERD: Not an issue L foot pain and mild swelling, since last year  these have been persistent but stable. Chest pain: See HPI, quite atypical, EKG today: NSR, pain is atypical, recommend observation. FH hypertrophic cardiomyopathy: Was recommended to have an echo every 3 to 5 years, last echo 2019, plan: Cardiology referral, I wonder if something other than the echocardiogram is needed. RTC 1 year

## 2023-08-05 NOTE — Patient Instructions (Addendum)
Vaccines I recommend: COVID booster and a flu shot this fall  Exercise goal: 3 hours a week  Will refer you to cardiology for your family history of cardiomyopathy  GO TO THE LAB : Get the blood work     GO TO THE FRONT DESK, PLEASE SCHEDULE YOUR APPOINTMENTS Come back for in 3 months, would like to talk about weight management.

## 2023-08-05 NOTE — Assessment & Plan Note (Signed)
Here for CPX GERD: Not an issue L foot pain and mild swelling, since last year these have been persistent but stable. Chest pain: See HPI, quite atypical, EKG today: NSR, pain is atypical, recommend observation. FH hypertrophic cardiomyopathy: Was recommended to have an echo every 3 to 5 years, last echo 2019, plan: Cardiology referral, I wonder if something other than the echocardiogram is needed. RTC 1 year

## 2023-10-14 ENCOUNTER — Ambulatory Visit: Payer: 59 | Attending: Cardiology | Admitting: Cardiology

## 2023-10-14 ENCOUNTER — Encounter: Payer: Self-pay | Admitting: Cardiology

## 2023-10-14 VITALS — BP 130/82 | Ht 68.0 in | Wt 265.0 lb

## 2023-10-14 DIAGNOSIS — R0602 Shortness of breath: Secondary | ICD-10-CM

## 2023-10-14 DIAGNOSIS — Z823 Family history of stroke: Secondary | ICD-10-CM | POA: Diagnosis not present

## 2023-10-14 DIAGNOSIS — R0789 Other chest pain: Secondary | ICD-10-CM | POA: Diagnosis not present

## 2023-10-14 DIAGNOSIS — Z131 Encounter for screening for diabetes mellitus: Secondary | ICD-10-CM | POA: Diagnosis not present

## 2023-10-14 NOTE — Addendum Note (Signed)
Addended by: Raelyn Number on: 10/14/2023 04:11 PM   Modules accepted: Orders

## 2023-10-14 NOTE — Progress Notes (Signed)
Cardiology Office Note:    Date:  10/14/2023   ID:  Anna Cabrera, Anna Cabrera Sep 21, 1981, MRN [id number]  PCP:  Wanda Plump, MD  Cardiologist:  Thomasene Ripple, DO  Electrophysiologist:  None   Referring MD: Wanda Plump, MD   " I have had a some chest pain"  History of Present Illness:    Anna Cabrera is a 42 y.o. female with a hx of  significant family history of cardiovascular disease, presents for follow-up after a screening echocardiogram in 2019. The patient's mother has hypertrophic cardiomyopathy, and there is a history of multiple strokes and atrial fibrillation on the father's side. The patient has been experiencing intermittent sharp chest pains for several years, which were previously attributed to stress. But the pain continues. The pains are brief and not associated with any particular activity or trigger. There is some shortness of breath on exertion.  The patient also reports occasional tachycardia, particularly after physical exertion such as climbing stairs. The patient acknowledges a lack of regular exercise due to a combination of time constraints and lack of motivation. The patient has a Mirena IUD and has had one successful pregnancy.  She denies any adverse pregnancy outcomes.  Past Medical History:  Diagnosis Date   GERD (gastroesophageal reflux disease)    H/O headache    HPV in female     Past Surgical History:  Procedure Laterality Date   BREAST REDUCTION SURGERY  2004   CESAREAN SECTION  2011   CYST REMOVAL HAND Right 1996   IR RADIOLOGIST EVAL & MGMT  09/03/2022    Current Medications: Current Meds  Medication Sig   levonorgestrel (MIRENA) 20 MCG/24HR IUD 1 each by Intrauterine route once.       Allergies:   Maxidone [hydrocodone-acetaminophen] and Ortho evra [norelgestromin-eth estradiol]   Social History   Socioeconomic History   Marital status: Married    Spouse name: Radio producer   Number of children: 1   Years of education: Associates   Highest  education level: Not on file  Occupational History   Occupation: Clinical cytogeneticist: [employer]  Tobacco Use   Smoking status: Former    Current packs/day: 0.00    Average packs/day: 1 pack/day for 7.0 years (7.0 ttl pk-yrs)    Types: Cigarettes    Start date: 09/10/1996    Quit date: 03/16/2003    Years since quitting: 20.5   Smokeless tobacco: Never  Substance and Sexual Activity   Alcohol use: Yes    Alcohol/week: 0.0 standard drinks of alcohol    Comment: rarely - wine   Drug use: No   Sexual activity: Not on file  Other Topics Concern   Not on file  Social History Narrative   Lives at home with spouse and daughter   Right handed.   Caffeine use: Drinks 4-6 drinks per day (soda and tea)   Associated degree       [facility]:   Originally from [place]. Has always lived in [place]. Previously has traveled to [place], [place], [place], [place], [place], [place]. 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 Always had a negative PPD test. Has a dog currently. No bird or mold. She has prior exposure to a hot tub appoximately 1 year ago. Enjoys reading.    Social Determinants of Health   Financial Resource Strain: Not on file  Food Insecurity: Not on file  Transportation Needs: Not  on file  Physical Activity: Not on file  Stress: Not on file  Social Connections: Not on file     Family History: The patient's family history includes CAD in her father; Cardiomyopathy (age of onset: 110) in her mother; Dementia in her father and paternal uncle; Diabetes in her father; High Cholesterol in her father; Hypertension in her father; Migraines in her father and mother; Stroke in her father, paternal grandmother, and another family member. There is no history of Cancer - Colon, Breast cancer, or Cancer.  ROS:   Review of Systems  Constitution: Negative for decreased appetite, fever and weight gain.  HENT: Negative for congestion, ear discharge, hoarse voice  and sore throat.   Eyes: Negative for discharge, redness, vision loss in right eye and visual halos.  Cardiovascular: Negative for chest pain, dyspnea on exertion, leg swelling, orthopnea and palpitations.  Respiratory: Negative for cough, hemoptysis, shortness of breath and snoring.   Endocrine: Negative for heat intolerance and polyphagia.  Hematologic/Lymphatic: Negative for bleeding problem. Does not bruise/bleed easily.  Skin: Negative for flushing, nail changes, rash and suspicious lesions.  Musculoskeletal: Negative for arthritis, joint pain, muscle cramps, myalgias, neck pain and stiffness.  Gastrointestinal: Negative for abdominal pain, bowel incontinence, diarrhea and excessive appetite.  Genitourinary: Negative for decreased libido, genital sores and incomplete emptying.  Neurological: Negative for brief paralysis, focal weakness, headaches and loss of balance.  Psychiatric/Behavioral: Negative for altered mental status, depression and suicidal ideas.  Allergic/Immunologic: Negative for HIV exposure and persistent infections.    EKGs/Labs/Other Studies Reviewed:    The following studies were reviewed today:   EKG:  None today   Recent Labs: 08/05/2023: ALT 12; BUN 8; Creatinine, Ser 0.75; Hemoglobin 13.3; Platelets 363.0; Potassium 4.1; Sodium 136; TSH 2.37  Recent Lipid Panel    Component Value Date/Time   CHOL 183 08/05/2023 1350   TRIG 99.0 08/05/2023 1350   HDL 61.50 08/05/2023 1350   CHOLHDL 3 08/05/2023 1350   VLDL 19.8 08/05/2023 1350   LDLCALC 102 (H) 08/05/2023 1350   LDLCALC 87 11/15/2020 1442    Physical Exam:    VS:  BP 130/82 (BP Location: Right Arm, Patient Position: Sitting, Cuff Size: Large)   Ht 5\' 8"  (1.727 m)   Wt 265 lb (120.2 kg)   BMI 40.29 kg/m     Wt Readings from Last 3 Encounters:  10/14/23 265 lb (120.2 kg)  08/05/23 259 lb 9.6 oz (117.8 kg)  08/01/22 253 lb 8.5 oz (115 kg)     GEN: Well nourished, well developed in no acute  distress HEENT: Normal NECK: No JVD; No carotid bruits LYMPHATICS: No lymphadenopathy CARDIAC: S1S2 noted,RRR, no murmurs, rubs, gallops RESPIRATORY:  Clear to auscultation without rales, wheezing or rhonchi  ABDOMEN: Soft, non-tender, non-distended, +bowel sounds, no guarding. EXTREMITIES: No edema, No cyanosis, no clubbing MUSCULOSKELETAL:  No deformity  SKIN: Warm and dry NEUROLOGIC:  Alert and oriented x 3, non-focal PSYCHIATRIC:  Normal affect, good insight  ASSESSMENT:    1. Family history of stroke   2. Atypical chest pain   3. Screening for diabetes mellitus (DM)    PLAN:     Family History of Hypertrophic Cardiomyopathy Mother diagnosed with hypertrophic cardiomyopathy. Initial screening echo performed in 2019. No significant changes or symptoms since then. Noted occasional sharp chest pains, but these have been present for years and are not associated with exertion. -Plan for stress echo to assess for any dynamic changes in  during exercise.  Family History  of Stroke and Atrial Fibrillation Father and paternal relatives have history of multiple strokes. Father also has atrial fibrillation and hypertension. -No specific plan discussed in the conversation.  Intermittent Chest Pain Reports occasional sharp chest pains, present for years, not associated with exertion or other symptoms. Previous heart monitor did not show any abnormalities. -Plan for stress echo to further evaluate these symptoms.  Hyperlipidemia Reports family history of high cholesterol. No current medications. -Continue monitoring.  Risk Stratification Given family history of cardiovascular disease, discussed importance of risk stratification. -Plan to screen for diabetes and check Lipoprotein A levels.  Potential Participation in African American Heart Study Discussed potential participation in study given patient's identification as African American. -Patient to consider participation.  General  Health Maintenance Currently using Mirena for contraception. -Continue current contraceptive method.  Follow-up -Schedule stress echo before leaving today. -Return for follow-up in 16 weeks, unless stress echo results necessitate earlier follow-up.  The patient is in agreement with the above plan. The patient left the office in stable condition.  The patient will follow up in 16 weeks virtually.   Medication Adjustments/Labs and Tests Ordered: Current medicines are reviewed at length with the patient today.  Concerns regarding medicines are outlined above.  Orders Placed This Encounter  Procedures   Lipoprotein A (LPA)   Hemoglobin A1c   ECHOCARDIOGRAM STRESS TEST   No orders of the defined types were placed in this encounter.   Patient Instructions  Medication Instructions:  NO CHANGES    Lab Work: LPA A1C   Testing/Procedures:    Follow-Up: At [facility], you and your health needs are our priority.  As part of our continuing mission to provide you with exceptional heart care, we have created designated Provider Care Teams.  These Care Teams include your primary Cardiologist (physician) and Advanced Practice Providers (APPs -  Physician Assistants and Nurse Practitioners) who all work together to provide you with the care you need, when you need it.  We recommend signing up for the patient portal called "MyChart".  Sign up information is provided on this After Visit Summary.  MyChart is used to connect with patients for Virtual Visits (Telemedicine).  Patients are able to view lab/test results, encounter notes, upcoming appointments, etc.  Non-urgent messages can be sent to your provider as well.   To learn more about what you can do with MyChart, go to [email or web address].    Your next appointment:   16 WEEKS (VIRTUAL)  Provider:   DR. Lavona Mound Linde Wilensky        Adopting a Healthy Lifestyle.  Know what a healthy weight is for you (roughly BMI <25) and  aim to maintain this   Aim for 7+ servings of fruits and vegetables daily   65-80+ fluid ounces of water or unsweet tea for healthy kidneys   Limit to max 1 drink of alcohol per day; avoid smoking/tobacco   Limit animal fats in diet for cholesterol and heart health - choose grass fed whenever available   Avoid highly processed foods, and foods high in saturated/trans fats   Aim for low stress - take time to unwind and care for your mental health   Aim for 150 min of moderate intensity exercise weekly for heart health, and weights twice weekly for bone health   Aim for 7-9 hours of sleep daily   When it comes to diets, agreement about the perfect plan isnt easy to find, even among the experts. Experts at the [employer] developed an idea known as the Healthy Eating Plate. Just imagine a plate divided into logical, healthy portions.   The emphasis is on diet quality:   Load up on vegetables and fruits - one-half of your plate: Aim for color and variety, and remember that potatoes dont count.   Go for whole grains - one-quarter of your plate: Whole wheat, barley, wheat berries, quinoa, oats, brown rice, and foods made with them. If you want pasta, go with whole wheat pasta.   Protein power - one-quarter of your plate: Fish, chicken, beans, and nuts are all healthy, versatile protein sources. Limit red meat.   The diet, however, does go beyond the plate, offering a few other suggestions.   Use healthy plant oils, such as olive, canola, soy, corn, sunflower and peanut. Check the labels, and avoid partially hydrogenated oil, which have unhealthy trans fats.   If youre thirsty, drink water. Coffee and tea are good in moderation, but skip sugary drinks and limit milk and dairy products to one or two daily servings.   The type of carbohydrate in the diet is more important than the amount. Some sources of carbohydrates, such as vegetables, fruits, whole grains, and  beans-are healthier than others.   Finally, stay active  Signed, Thomasene Ripple, DO  10/14/2023 1:46 PM    [facility]

## 2023-10-14 NOTE — Patient Instructions (Addendum)
Medication Instructions:  NO CHANGES    Lab Work: LPA A1C   Testing/Procedures: Your physician has requested that you have a stress echocardiogram. For further information please visit [email or web address] Please follow instruction sheet as given.      Stress Echocardiogram Information Sheet                                                      Instructions:    1. You may take your morning medications the morning of the test  2. Light breakfast no caffeine  3. Dress prepared to exercise.  4. DO NOT use ANY caffeine or tobacco products 3 hours before appointment.  5. Please bring all current prescription medications.     Follow-Up: At [facility] you and your health needs are our priority.  As part of our continuing mission to provide you with exceptional heart care, we have created designated Provider Care Teams.  These Care Teams include your primary Cardiologist (physician) and Advanced Practice Providers (APPs -  Physician Assistants and Nurse Practitioners) who all work together to provide you with the care you need, when you need it.  We recommend signing up for the patient portal called "MyChart".  Sign up information is provided on this After Visit Summary.  MyChart is used to connect with patients for Virtual Visits (Telemedicine).  Patients are able to view lab/test results, encounter notes, upcoming appointments, etc.  Non-urgent messages can be sent to your provider as well.   To learn more about what you can do with MyChart, go to [email or web address].    Your next appointment:   16 WEEKS (VIRTUAL)  Provider:   DR. Lavona Mound TOBB

## 2023-10-16 LAB — HEMOGLOBIN A1C
Est. average glucose Bld gHb Est-mCnc: 105 mg/dL
Hgb A1c MFr Bld: 5.3 % (ref 4.8–5.6)

## 2023-10-16 LAB — LIPOPROTEIN A (LPA): Lipoprotein (a): 86.5 nmol/L — ABNORMAL HIGH (ref <75.0)

## 2023-10-21 NOTE — Addendum Note (Signed)
Addended by: Thomasene Ripple on: 10/21/2023 09:00 AM   Modules accepted: Orders

## 2023-11-05 ENCOUNTER — Ambulatory Visit (INDEPENDENT_AMBULATORY_CARE_PROVIDER_SITE_OTHER): Payer: 59 | Admitting: Internal Medicine

## 2023-11-05 VITALS — BP 126/80 | HR 73 | Temp 97.9°F | Resp 16 | Ht 68.0 in | Wt 267.5 lb

## 2023-11-05 DIAGNOSIS — E669 Obesity, unspecified: Secondary | ICD-10-CM | POA: Diagnosis not present

## 2023-11-05 DIAGNOSIS — Z6841 Body Mass Index (BMI) 40.0 and over, adult: Secondary | ICD-10-CM

## 2023-11-05 NOTE — Progress Notes (Signed)
   Subjective:    Patient ID: Anna Cabrera, female    DOB: [birth year], 42 y.o.   MRN: [id number]  DOS:  11/05/2023 Type of visit - description: Here for weight management  She has a long history of obesity since her teenage years. Has tried different diets. At some point was Rx hCG on phentermine with good results.  Currently, her diet is not mindful, eats on the go.  She does not exercise, "I could".  She admits to some snoring but no apnea.  Does not fall asleep easily.  Review of Systems See above   Past Medical History:  Diagnosis Date   GERD (gastroesophageal reflux disease)    H/O headache    HPV in female     Past Surgical History:  Procedure Laterality Date   BREAST REDUCTION SURGERY  2004   CESAREAN SECTION  2011   CYST REMOVAL HAND Right 1996   IR RADIOLOGIST EVAL & MGMT  09/03/2022    Current Outpatient Medications  Medication Instructions   levonorgestrel (MIRENA) 20 MCG/24HR IUD 1 each,  Once       Objective:   Physical Exam BP 126/80   Pulse 73   Temp 97.9 F (36.6 C) (Oral)   Resp 16   Ht 5\' 8"  (1.727 m)   Wt 267 lb 8 oz (121.3 kg)   SpO2 97%   BMI 40.67 kg/m  General:   Well developed, NAD, BMI noted. HEENT:  Normocephalic . Face symmetric, atraumatic Lower extremities: no pretibial edema bilaterally  Skin: Not pale. Not jaundice Neurologic:  alert & oriented X3.  Speech normal, gait appropriate for age and unassisted Psych--  Cognition and judgment appear intact.  Cooperative with normal attention span and concentration.  Behavior appropriate. No anxious or depressed appearing.      Assessment    Assessment GERD HPV History of abnormal chest x-ray History of migraines Obesity, BMI 38 IUD FH hypertrophic cardiomyopathy: Echo -2019, recheck echo every 3 to 5 years Left buttock seroma, persistent  PLAN Morbid obesity Patient is here for management of morbid obesity. Alternatives includes only diet or diet and  medications: GLP-1s, Qsymia,  Starlix and others. Injectables are not covered for by her insurance. We had a long conversation about the role of diet, exercise which should be gradual. Plan: Referred to a nutritionist. Start Qsymia noting that there was some alert of interaction with Mirena however I consulted with our clinical pharmacist and it turns out that the interaction with intrauterine devices is not significant.  See below. Rx sent This is what I found below - it looks like since she is using intrauterine device that the interaction between Mirena and Qysemia is not significant.   [email or web address]  This is also a link to a study that reviewed 14,504 levonorgestrel case reports for oral, implants (arm) and intrauterine levonorgestrel contraceptions and CYP3A4 medication use. The reporting odds ratio (ROR) was increased for oral (ROR = 4.2 [3.0-5.7]), implants (ROR = 8.0 [5.8-11.0]), but not intrauterine (ROR = 0.9 [0.6-1.3]) levonorgestrel products. For 9,348 etonogestrel/desogestrel case reports, oral and vaginal products were not associated with contraceptive failure. Etonogestrel containing implants (ROR = 4.9 [4.1-5.9]) were associated with increased contraceptive failure  RTC 3 months

## 2023-11-05 NOTE — Patient Instructions (Addendum)
Vaccines I recommend: Covid booster   Next visit with me  in 3 months  Please schedule it at the front desk

## 2023-11-09 ENCOUNTER — Encounter: Payer: Self-pay | Admitting: Internal Medicine

## 2023-11-09 MED ORDER — QSYMIA 3.75-23 MG PO CP24
ORAL_CAPSULE | ORAL | 0 refills | Status: AC
  Filled 2023-11-09 – 2023-11-19 (×5): qty 60, 37d supply, fill #0
  Filled 2023-11-25: qty 14, 14d supply, fill #0

## 2023-11-09 NOTE — Assessment & Plan Note (Signed)
Morbid obesity Patient is here for management of morbid obesity. Alternatives includes only diet or diet and medications: GLP-1s, Qsymia,  Starlix and others. Injectables are not covered for by her insurance. We had a long conversation about the role of diet, exercise which should be gradual. Plan: Referred to a nutritionist. Start Qsymia noting that there was some alert of interaction with Mirena however I consulted with our clinical pharmacist and it turns out that the interaction with intrauterine devices is not significant.  See below. Rx sent This is what I found below - it looks like since she is using intrauterine device that the interaction between Mirena and Qysemia is not significant.   [email or web address]  This is also a link to a study that reviewed 14,504 levonorgestrel case reports for oral, implants (arm) and intrauterine levonorgestrel contraceptions and CYP3A4 medication use. The reporting odds ratio (ROR) was increased for oral (ROR = 4.2 [3.0-5.7]), implants (ROR = 8.0 [5.8-11.0]), but not intrauterine (ROR = 0.9 [0.6-1.3]) levonorgestrel products. For 9,348 etonogestrel/desogestrel case reports, oral and vaginal products were not associated with contraceptive failure. Etonogestrel containing implants (ROR = 4.9 [4.1-5.9]) were associated with increased contraceptive failure  RTC 3 months

## 2023-11-10 ENCOUNTER — Other Ambulatory Visit: Payer: Self-pay

## 2023-11-11 ENCOUNTER — Other Ambulatory Visit: Payer: Self-pay

## 2023-11-11 ENCOUNTER — Telehealth: Payer: Self-pay

## 2023-11-11 NOTE — Telephone Encounter (Signed)
PA initiated via Covermymeds; KEY: [id number]. Awaiting determination.

## 2023-11-13 ENCOUNTER — Encounter: Payer: Self-pay | Admitting: Dietician

## 2023-11-13 ENCOUNTER — Encounter: Payer: 59 | Attending: Internal Medicine | Admitting: Dietician

## 2023-11-13 ENCOUNTER — Other Ambulatory Visit: Payer: Self-pay

## 2023-11-13 VITALS — Ht 68.0 in | Wt 267.3 lb

## 2023-11-13 DIAGNOSIS — Z6837 Body mass index (BMI) 37.0-37.9, adult: Secondary | ICD-10-CM

## 2023-11-13 NOTE — Patient Instructions (Addendum)
Keep your water bottle with you each day and work towards drinking the entire 64 oz. Remember, more is more!!  Take a look at your schedule each week to see where you have a break, or the ability to schedule a small block of time to ride your stationary bike. This can be for 5-10 minutes at a leisurely pace.  Start your day with a Premier protein shake each morning. Have half first thing in the morning and    Choose lean proteins as often as possible (Low fat meats, low or fat free dairy, poultry in place pork like sausage, hot dogs, bacon).

## 2023-11-13 NOTE — Progress Notes (Signed)
Medical Nutrition Therapy  Appointment Start time:  0820  Appointment End time:  0940 Employee Wellness visit 1 of 3 EID#: [id number]  Primary concerns today: Weight loss  Referral diagnosis: E66.9 - Obesity Preferred learning style: No preference indicated Learning readiness: Contemplating   NUTRITION ASSESSMENT   Anthropometrics Ht: 68" Wt: 267.3 lbs BMI: 40.64 kg/m2 Goal weight: 185 lbs   Clinical Medical Hx: Obesity Medications: Phentermine-Topiramate (Will start soon, pending prior auth) Labs: LPA - 86.5 (high), LDL - 102 (high) Notable Signs/Symptoms: N/A   Lifestyle & Dietary Hx Pt reports a goal of losing weight, and that they are flexible in trying measures to achieve it. Pt reports taking a course of phentermine for weight loss years ago and had success with weight loss (~50 - 60 lbs over 1 year), states they went through diet education at this time and was to the gym as well.  Pt reports working from home most days, busy during the day, doesn't take lunch until ~3:00 to pick up daughter. Pt reports being sedentary most days, will walk dog for activity, pt has a stationary bike at home right next to workstation, but lacks motivation to use it. Pt reports there aren't a lot of moments during the week for time strictly for themselves, may have time on Saturday Pt reports teenage daughter is very picky, states it can be difficult to find balance in providing meals for themselves as well as their family. Pt reports missing breakfast most days, grabs snack/quick meal for lunch, always eats dinner. Pt reports drinking too many sodas, finds it to be a big obstacle, drinks very little plain water (<16 oz).   Estimated daily fluid intake: <64 oz Supplements: N/A Sleep: Sleeps fine Stress / self-care: Mild Current average weekly physical activity: ADL, mostly sedentary   24-Hr Dietary Recall First Meal: coffee Snack: none Second Meal: Cookout bacon cheeseburger, fries,  Cheerwine Snack: none Third Meal: Beef Lo Mein, 2 chicken wings, Crab [place] Snack: none Beverages: Cheerwine, Sprite ZERO   NUTRITION DIAGNOSIS  NB-1.1 Food and nutrition-related knowledge deficit As related to obesity.  As evidenced by BMI of 40.64 kg/m2, inconsistent dietary pattern, recall high in energy dense foods and SSB.   NUTRITION INTERVENTION  Nutrition education (E-1) on the following topics:  Educated patient on the two components of energy balance: Energy in (calories), and energy out (activity). Explain the role of negative energy balance in weight loss. Discussed options with patient to achieve a negative energy balance and how to best control energy in and energy out to accommodate their lifestyle.   Handouts Provided Include  AVS  Learning Style & Readiness for Change Teaching method utilized: Visual & Auditory  Demonstrated degree of understanding via: Teach Back  Barriers to learning/adherence to lifestyle change: Busy schedule  Goals Established by Pt Keep your water bottle with you each day and work towards drinking the entire 64 oz. Remember, more is more!! Take a look at your schedule each week to see where you have a break, or the ability to schedule a small block of time to ride your stationary bike. This can be for 5-10 minutes at a leisurely pace. Start your day with a Premier protein shake each morning. Have half first thing in the morning and   Choose lean proteins as often as possible (Low fat meats, low or fat free dairy, poultry in place pork like sausage, hot dogs, bacon).   MONITORING & EVALUATION Dietary intake, weekly physical activity, and habit change  in 1 month.  Next Steps  Patient is to follow up for Employee Wellness visit 2 of 3.

## 2023-11-14 NOTE — Telephone Encounter (Signed)
PA partially approved.   The request has been partially denied. The authorization is effective from 11/13/2023 to 11/27/2023, as long as the member is enrolled in their current health plan. This request has been approved for 1 capsule per day.An additional authorization has been entered for Qsymia 7.5/46mg  with a quantity limit of 1 capsule per day effective 11/30/2023 through 02/28/2024, please reference authorization [id number]. We were asked to cover a larger amount of the drug listed at the top of this letter than allowed under our quantity limit rule. Your provider requested Qsymia 3.75/23 mg capsules with directions to take one capsule daily for 14 days then increasing to two (2) Qsymia 3.75/23 mg capsules daily but your plan only covers Qsymia 3.75/23 mg capsules for a total of 14 days. You were approved for a lower quantity than requested based on our guideline named GENERAL QUANTITY EXCEPTION CRITERIA (reviewed for Qsymia). In order for this request to be approved at the quantity requested, your provider would need to show that you have tried the highest capsule strength available that is covered under your pharmacy benefit to achieve the same total daily dose, this is called dose consolidation. This means you must try taking one (1) Qsymia 7.5/46 mg capsule per day instead of two (2) Qsymia 3.75/23 mg capsules daily for a total daily dose of 7.5/46 mg per day, unless there is a valid medical reason why you cannot try dose consolidation. Please note, a prior authorization has been approved for you for Qsymia 3.75/23 mg capsules for one capsule daily for 14 days followed by Qsymia 7.5/46 mg capsules for one capsule daily for three months. If your provider writes separate prescriptions for both strengths of the medication, they will be covered for you. Contact your doctor to discuss use of this medication at the quantity that has been approved and other alternatives further. A written notification letter will  follow with additional details.

## 2023-11-18 ENCOUNTER — Encounter (HOSPITAL_COMMUNITY): Payer: Self-pay

## 2023-11-19 ENCOUNTER — Other Ambulatory Visit: Payer: Self-pay

## 2023-11-25 ENCOUNTER — Other Ambulatory Visit: Payer: Self-pay

## 2023-11-25 MED ORDER — QSYMIA 7.5-46 MG PO CP24
1.0000 | ORAL_CAPSULE | Freq: Every day | ORAL | 1 refills | Status: AC
  Filled 2023-11-25 – 2023-12-09 (×2): qty 30, 30d supply, fill #0
  Filled 2024-01-13: qty 30, 30d supply, fill #1

## 2023-11-26 ENCOUNTER — Ambulatory Visit (HOSPITAL_COMMUNITY): Payer: 59

## 2023-11-26 ENCOUNTER — Ambulatory Visit (HOSPITAL_COMMUNITY): Payer: 59 | Attending: Cardiology

## 2023-11-26 DIAGNOSIS — R0789 Other chest pain: Secondary | ICD-10-CM | POA: Insufficient documentation

## 2023-11-26 MED ORDER — PERFLUTREN LIPID MICROSPHERE
6.0000 mL | INTRAVENOUS | Status: AC | PRN
  Administered 2023-11-26: 6 mL via INTRAVENOUS

## 2023-11-27 MED FILL — Perflutren Lipid Microsphere IV Susp 1.1 MG/ML: INTRAVENOUS | Qty: 6 | Status: AC

## 2023-12-09 ENCOUNTER — Other Ambulatory Visit: Payer: Self-pay

## 2023-12-11 ENCOUNTER — Other Ambulatory Visit: Payer: Self-pay

## 2023-12-23 ENCOUNTER — Encounter: Payer: Self-pay | Admitting: Dietician

## 2023-12-23 ENCOUNTER — Encounter: Payer: 59 | Attending: Internal Medicine | Admitting: Dietician

## 2023-12-23 VITALS — Ht 68.0 in | Wt 262.3 lb

## 2023-12-23 DIAGNOSIS — E669 Obesity, unspecified: Secondary | ICD-10-CM | POA: Insufficient documentation

## 2023-12-23 NOTE — Progress Notes (Signed)
 Medical Nutrition Therapy  Appointment Start time:  0800  Appointment End time:  86 Employee Wellness visit 2 of 3 EID#: [id number]  Primary concerns today: Weight loss  Referral diagnosis: E66.9 - Obesity Preferred learning style: No preference indicated Learning readiness: Contemplating   NUTRITION ASSESSMENT   Anthropometrics Ht: 68 Wt: 262.3 lbs BMI: 39.88 kg/m2 Wt Change: -5 lbs x5 weeks Goal weight: 185 lbs   Clinical Medical Hx: Obesity Medications: Phentermine -Topiramate  (Will start soon, pending prior auth) Labs: LPA - 86.5 (high), LDL - 102 (high) Notable Signs/Symptoms: N/A   Lifestyle & Dietary Hx Pt reports starting to take Qsymia  since last visit, no side effects.  Pt reports trying to increase water intake, has been getting in 48-64 oz., states they would put a couple 16 oz. bottles on their work desk and/or use 48 oz tumbler. Pt reports starting to ride their stationary bike more, blocked off schedule at work and set reminder to get up and ride bike for 10 minutes. Pt reports trying Premier protein shakes for breakfast or lunch, snacking on cucumbers. Pt reports recent loss in family has made it tough to focus on changes in the [past week, but still feels motivated to continue progress   Estimated daily fluid intake: <64 oz Supplements: N/A Sleep: Sleeps fine Stress / self-care: Mild Current average weekly physical activity: ADL, mostly sedentary   24-Hr Dietary Recall First Meal: 1/2 grilled cheese, kielbasa sausage, Coffee w/ creamer Snack: none Second Meal: Hot dog, few fries Snack: none Third Meal: Rice, country fried steak and gravy Snack: none Beverages: 32 oz water, GingerAle ZERO   NUTRITION DIAGNOSIS  NB-1.1 Food and nutrition-related knowledge deficit As related to obesity.  As evidenced by BMI of 40.64 kg/m2, inconsistent dietary pattern, recall high in energy dense foods and SSB.   NUTRITION INTERVENTION  Nutrition education (E-1) on  the following topics:  Educated patient on the two components of energy balance: Energy in (calories), and energy out (activity). Explain the role of negative energy balance in weight loss. Discussed options with patient to achieve a negative energy balance and how to best control energy in and energy out to accommodate their lifestyle. NEW: Educate pt on factors that can elevate LDL cholesterol, including high dietary intake of saturated fats. Educate pt on identifying sources of saturated fats, and how to make alternative food choices to lower saturated fat intake. Educate pt on the role of soluble fiber in binding to cholesterol in the GI tract an eliminating it from the body. Educate pt on dietary sources of soluble fiber. Educate on the role of elevated LDL,total cholesterol, and triglycerides on cardiovascular health. Educate pt on the role of physical activity in lowering LDL and increasing HDL cholesterol.   Handouts Provided Include  AVS NEW: Saturated Fats Handout NEW: Non-Starchy Vegetables List  Learning Style & Readiness for Change Teaching method utilized: Visual & Auditory  Demonstrated degree of understanding via: Teach Back  Barriers to learning/adherence to lifestyle change: Busy schedule  Goals Established by Pt Use a combination of these three strategies to effectively lower your consumption of saturated fats 1) Consume a smaller portion when having high fat foods 2) Consume high fat foods less frequently 3) Find a reduced or low fat version Choose lean proteins as often as possible! Work to incorporate more non-starchy vegetables into your dinner meals! Consider using Steam-In-Bag frozen vegetables for an easy, cost effective option!   MONITORING & EVALUATION Dietary intake, weekly physical activity, and habit change in  1 month.  Next Steps  Patient is to follow up for Employee Wellness visit 3 of 3.

## 2023-12-23 NOTE — Patient Instructions (Addendum)
 Use a combination of these three strategies to effectively lower your consumption of saturated fats 1) Consume a smaller portion when having high fat foods 2) Consume high fat foods less frequently 3) Find a reduced or low fat version  Choose lean proteins as often as possible!  Work to incorporate more non-starchy vegetables into your dinner meals! Consider using Steam-In-Bag frozen vegetables for an easy, cost effective option!

## 2023-12-25 ENCOUNTER — Other Ambulatory Visit: Payer: Self-pay

## 2023-12-31 DIAGNOSIS — Z124 Encounter for screening for malignant neoplasm of cervix: Secondary | ICD-10-CM | POA: Diagnosis not present

## 2023-12-31 DIAGNOSIS — Z01419 Encounter for gynecological examination (general) (routine) without abnormal findings: Secondary | ICD-10-CM | POA: Diagnosis not present

## 2023-12-31 DIAGNOSIS — Z1151 Encounter for screening for human papillomavirus (HPV): Secondary | ICD-10-CM | POA: Diagnosis not present

## 2023-12-31 DIAGNOSIS — Z1231 Encounter for screening mammogram for malignant neoplasm of breast: Secondary | ICD-10-CM | POA: Diagnosis not present

## 2023-12-31 DIAGNOSIS — Z6839 Body mass index (BMI) 39.0-39.9, adult: Secondary | ICD-10-CM | POA: Diagnosis not present

## 2023-12-31 DIAGNOSIS — R5383 Other fatigue: Secondary | ICD-10-CM | POA: Diagnosis not present

## 2024-01-13 ENCOUNTER — Other Ambulatory Visit: Payer: Self-pay

## 2024-01-16 DIAGNOSIS — N870 Mild cervical dysplasia: Secondary | ICD-10-CM | POA: Diagnosis not present

## 2024-01-16 DIAGNOSIS — R87612 Low grade squamous intraepithelial lesion on cytologic smear of cervix (LGSIL): Secondary | ICD-10-CM | POA: Diagnosis not present

## 2024-01-23 ENCOUNTER — Encounter: Payer: Self-pay | Admitting: Dietician

## 2024-01-23 ENCOUNTER — Encounter: Payer: Commercial Managed Care - PPO | Attending: Internal Medicine | Admitting: Dietician

## 2024-01-23 VITALS — Ht 68.0 in | Wt 258.4 lb

## 2024-01-23 DIAGNOSIS — E669 Obesity, unspecified: Secondary | ICD-10-CM | POA: Insufficient documentation

## 2024-01-23 NOTE — Patient Instructions (Addendum)
Consider Vitamin D supplement due to low 2000 international units daily not at the same time of day as your multivitamin.   Try to utilize the balanced plate method for both lunch and dinner meals  Continue working on consuming breakfast meal  Keep up the great work and Counselling psychologist on your recent weight loss!!   Have a great time on your cruise!!

## 2024-01-23 NOTE — Progress Notes (Signed)
Medical Nutrition Therapy  Appointment Start time:  0830  Appointment End time:  0910 Employee Wellness visit 3 of 3 EID#: [id number]  Primary concerns today: Weight loss  Referral diagnosis: E66.9 - Obesity Preferred learning style: No preference indicated Learning readiness: Contemplating   NUTRITION ASSESSMENT   Anthropometrics Ht: 68" Wt: 258.4 lbs BMI: 39.29 kg/m2 Wt Change: -10 lbs x10 weeks Goal weight: 185 lbs   Clinical Medical Hx: Obesity Medications: Phentermine-Topiramate  Labs: LPA - 86.5 (high), LDL - 102 (high), NEW Vitamin D 12.1 (low) Notable Signs/Symptoms: N/A   Lifestyle & Dietary Hx Visit was conducted by dietetic intern, Limmie Patricia, under supervision of Hewitt Blade RDN, LDN.   Pt reports starting MVI and Calcium. Pt reports continued use of Qsymia since last visit, no side effects although is experiencing sporadic bouts of increased hunger. Pt reports increasing water consumption to about 32-48 oz daily. Pt reports using stationary bike more often than before but work has been busy. Pt reports improved eating frequency, typically consuming small breakfast, consistent lunch and dinner meal. Pt reports cutting back on red meat, and made a switch to veggie burgers, reducing bacon, choosing reduced fat cheese, and low fat fast food decisions.   Estimated daily fluid intake: <64 oz Supplements: MVI and Calcium Sleep: Sleeps fine Stress / self-care: moderate  Current average weekly physical activity: ADL, stationary bike   24-Hr Dietary Recall First Meal: Coffee with creamer  Snack: none Second Meal: Veggie burger on high fiber wrap with lettuce, tomato, mayo and mustard with low fat cheese with chips (small bag) Snack: none Third Meal: [place] cheese steak on fiber wrap, with sweet potato fries  Snack: none Beverages: 32 oz water, GingerAle ZERO, coke zero   NUTRITION DIAGNOSIS  NB-1.1 Food and nutrition-related knowledge deficit As related to obesity.  As  evidenced by BMI of 39.29 kg/m2, inconsistent dietary pattern, recall high in energy dense foods and SSB.   NUTRITION INTERVENTION  Nutrition education (E-1) on the following topics:  Educated patient on the two components of energy balance: Energy in (calories), and energy out (activity). Explain the role of negative energy balance in weight loss. Discussed options with patient to achieve a negative energy balance and how to best control energy in and energy out to accommodate their lifestyle. Educate pt on factors that can elevate LDL cholesterol, including high dietary intake of saturated fats. Educate pt on identifying sources of saturated fats, and how to make alternative food choices to lower saturated fat intake. Educate pt on the role of soluble fiber in binding to cholesterol in the GI tract an eliminating it from the body. Educate pt on dietary sources of soluble fiber. Educate on the role of elevated LDL,total cholesterol, and triglycerides on cardiovascular health. Educate pt on the role of physical activity in lowering LDL and increasing HDL cholesterol. Begin to recognize carbohydrates, proteins, and non-starchy vegetables in your food choices! Begin to build your meals using the proportions of the Balanced Plate. First, select your carb choice(s) for the meal. Make this 25% of your meal. Next, select your source of protein to pair with your carb choice(s). Make this another 25% of your meal. Finally, complete your meal with a variety of non-starchy vegetables. Make this the remaining 50% of your meal.  Handouts Provided Include  AVS Saturated Fats Handout Non-Starchy Vegetables List NEW: Balanced plate  Learning Style & Readiness for Change Teaching method utilized: Visual & Auditory  Demonstrated degree of understanding via: Teach Back  Barriers to learning/adherence to lifestyle change: Busy schedule  Goals Established by Pt Consider Vitamin D supplement due to low 2000  international units daily not at the same time of day as your multivitamin.  Try to utilize the balanced plate method for both lunch and dinner meals Continue working on consuming breakfast meal Keep up the great work and Counselling psychologist on your recent weight loss!!   MONITORING & EVALUATION Dietary intake, weekly physical activity, and habit change in PRN.  Next Steps  Patient is to schedule Employee Wellness visits PRN.

## 2024-01-27 DIAGNOSIS — Z30431 Encounter for routine checking of intrauterine contraceptive device: Secondary | ICD-10-CM | POA: Diagnosis not present

## 2024-02-09 ENCOUNTER — Ambulatory Visit: Payer: 59 | Admitting: Internal Medicine

## 2024-02-10 ENCOUNTER — Telehealth: Payer: 59 | Admitting: Cardiology

## 2024-03-23 DIAGNOSIS — Z30433 Encounter for removal and reinsertion of intrauterine contraceptive device: Secondary | ICD-10-CM | POA: Diagnosis not present

## 2024-03-23 DIAGNOSIS — T8339XA Other mechanical complication of intrauterine contraceptive device, initial encounter: Secondary | ICD-10-CM | POA: Diagnosis not present

## 2024-03-23 DIAGNOSIS — T8339XD Other mechanical complication of intrauterine contraceptive device, subsequent encounter: Secondary | ICD-10-CM | POA: Diagnosis not present

## 2024-05-14 DIAGNOSIS — Z30431 Encounter for routine checking of intrauterine contraceptive device: Secondary | ICD-10-CM | POA: Diagnosis not present

## 2024-05-14 DIAGNOSIS — L989 Disorder of the skin and subcutaneous tissue, unspecified: Secondary | ICD-10-CM | POA: Diagnosis not present

## 2024-12-06 ENCOUNTER — Ambulatory Visit: Admitting: Physician Assistant

## 2024-12-06 ENCOUNTER — Encounter: Payer: Self-pay | Admitting: Internal Medicine

## 2024-12-06 ENCOUNTER — Encounter: Payer: Self-pay | Admitting: Physician Assistant

## 2024-12-06 ENCOUNTER — Other Ambulatory Visit: Payer: Self-pay

## 2024-12-06 VITALS — BP 150/95

## 2024-12-06 DIAGNOSIS — D1801 Hemangioma of skin and subcutaneous tissue: Secondary | ICD-10-CM

## 2024-12-06 DIAGNOSIS — W908XXA Exposure to other nonionizing radiation, initial encounter: Secondary | ICD-10-CM | POA: Diagnosis not present

## 2024-12-06 DIAGNOSIS — L814 Other melanin hyperpigmentation: Secondary | ICD-10-CM | POA: Diagnosis not present

## 2024-12-06 DIAGNOSIS — L821 Other seborrheic keratosis: Secondary | ICD-10-CM

## 2024-12-06 DIAGNOSIS — Z1283 Encounter for screening for malignant neoplasm of skin: Secondary | ICD-10-CM

## 2024-12-06 DIAGNOSIS — D229 Melanocytic nevi, unspecified: Secondary | ICD-10-CM

## 2024-12-06 DIAGNOSIS — B360 Pityriasis versicolor: Secondary | ICD-10-CM | POA: Diagnosis not present

## 2024-12-06 DIAGNOSIS — L578 Other skin changes due to chronic exposure to nonionizing radiation: Secondary | ICD-10-CM

## 2024-12-06 MED ORDER — KETOCONAZOLE 2 % EX SHAM
1.0000 | MEDICATED_SHAMPOO | Freq: Once | CUTANEOUS | 0 refills | Status: AC
  Filled 2024-12-06: qty 120, 24d supply, fill #0

## 2024-12-06 NOTE — Progress Notes (Signed)
° °  New Patient Visit   Subjective  Anna Cabrera is a 43 y.o. female NEW PATIENT who presents for the following: Skin Cancer Screening and Full Body Skin Exam - No history of skin cancer or family history of melanoma. She has a spot on her upper back that gets irritated by her shirts.  The patient presents for Total-Body Skin Exam (TBSE) for skin cancer screening and mole check. The patient has spots, moles and lesions to be evaluated, some may be new or changing and the patient may have concern these could be cancer.  Accompanied by husband today.  The following portions of the chart were reviewed this encounter and updated as appropriate: medications, allergies, medical history  Review of Systems:  No other skin or systemic complaints except as noted in HPI or Assessment and Plan.  Objective  Well appearing patient in no apparent distress; mood and affect are within normal limits.  A full examination was performed including scalp, head, eyes, ears, nose, lips, neck, chest, axillae, abdomen, back, buttocks, bilateral upper extremities, bilateral lower extremities, hands, feet, fingers, toes, fingernails, and toenails. All findings within normal limits unless otherwise noted below.   Relevant physical exam findings are noted in the Assessment and Plan.    Assessment & Plan   SKIN CANCER SCREENING PERFORMED TODAY.  ACTINIC DAMAGE - Chronic condition, secondary to cumulative UV/sun exposure - diffuse scaly erythematous macules with underlying dyspigmentation - Recommend daily broad spectrum sunscreen SPF 30+ to sun-exposed areas, reapply every 2 hours as needed.  - Staying in the shade or wearing long sleeves, sun glasses (UVA+UVB protection) and wide brim hats (4-inch brim around the entire circumference of the hat) are also recommended for sun protection.  - Call for new or changing lesions.  LENTIGINES, SEBORRHEIC KERATOSES, HEMANGIOMAS - Benign normal skin lesions -  Benign-appearing - Call for any changes  MELANOCYTIC NEVI - Tan-brown and/or pink-flesh-colored symmetric macules and papules - Benign appearing on exam today - Observation - Call clinic for new or changing moles - Recommend daily use of broad spectrum spf 30+ sunscreen to sun-exposed areas.    Tinea Versicolor  Use ketoconazole  shampoo as body wash daily until clear then once a week for prevention. Let sit a few minutes before rinsing.   Tinea versicolor is a chronic recurrent skin rash causing discolored scaly spots most commonly seen on back, chest, and/or shoulders.  It is generally asymptomatic. The rash is due to overgrowth of a common type of yeast present on everyone's skin and it is not contagious.  It tends to flare more in the summer due to increased sweating on trunk.  After rash is treated, the scaliness will resolve, but the discoloration will take longer to return to normal pigmentation. The periodic use of an OTC medicated soap/shampoo with zinc or selenium sulfide can be helpful to prevent yeast overgrowth and recurrence.  ACTINIC SKIN DAMAGE   CHERRY ANGIOMA   LENTIGINES   MULTIPLE BENIGN NEVI   SEBORRHEIC KERATOSIS   SCREENING EXAM FOR SKIN CANCER   TINEA VERSICOLOR    Return for 1-2 years for TBSE.  I, Roseline Hutchinson, CMA, am acting as scribe for Allin Frix K, PA-C .   Documentation: I have reviewed the above documentation for accuracy and completeness, and I agree with the above.  Fredrica Capano K, PA-C

## 2024-12-06 NOTE — Patient Instructions (Signed)

## 2025-04-12 ENCOUNTER — Encounter: Admitting: Internal Medicine
